# Patient Record
Sex: Female | Born: 2000 | Race: White | Hispanic: No | Marital: Single | Smoking: Current every day smoker
Health system: Southern US, Community
[De-identification: ages and names within clinical notes are randomized; demographics above are authoritative.]

## PROBLEM LIST (undated history)

## (undated) DIAGNOSIS — G43909 Migraine, unspecified, not intractable, without status migrainosus: Secondary | ICD-10-CM

## (undated) HISTORY — PX: DILATION AND CURETTAGE OF UTERUS: SHX78

## (undated) HISTORY — DX: Migraine, unspecified, not intractable, without status migrainosus: G43.909

---
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
## 2020-01-09 MED FILL — ELINEST 0.3-30 MG-MCG TABS: 0.3-30 | 56 days supply | Qty: 56 | Fill #0

## 2020-01-16 MED FILL — SERTRALINE HCL 50 MG TABS: 50 | 30 days supply | Qty: 30 | Fill #2

## 2020-02-19 MED FILL — SERTRALINE HCL 50 MG TABLET: 50 | 30 days supply | Qty: 30 | Fill #3

## 2020-03-03 ENCOUNTER — Telehealth: Payer: 59 | Admitting: Family

## 2020-03-03 DIAGNOSIS — F418 Other specified anxiety disorders: Secondary | ICD-10-CM

## 2020-03-03 NOTE — Progress Notes (Signed)
Attempted to reach the patient by text/ MyChart on 2 occasions; called and left VM to call back if she wanted the appointment;

## 2020-03-19 ENCOUNTER — Other Ambulatory Visit: Payer: Self-pay | Admitting: Family

## 2020-03-19 ENCOUNTER — Ambulatory Visit (INDEPENDENT_AMBULATORY_CARE_PROVIDER_SITE_OTHER): Payer: 59 | Admitting: Family

## 2020-03-19 ENCOUNTER — Other Ambulatory Visit: Payer: Self-pay

## 2020-03-19 VITALS — BP 112/74 | HR 74 | Temp 98.5°F | Ht 66.26 in | Wt 134.0 lb

## 2020-03-19 DIAGNOSIS — Z308 Encounter for other contraceptive management: Secondary | ICD-10-CM | POA: Diagnosis not present

## 2020-03-19 DIAGNOSIS — F418 Other specified anxiety disorders: Secondary | ICD-10-CM

## 2020-03-19 MED ORDER — SERTRALINE HCL 50 MG PO TABS: 50.0000 mg | ORAL_TABLET | Freq: Every day | ORAL | 1 refills | Status: DC

## 2020-03-19 MED ORDER — NORGESTREL-ETHINYL ESTRADIOL 0.3-30 MG-MCG PO TABS: 1.0000 | ORAL_TABLET | Freq: Every day | ORAL | 0 refills | Status: DC

## 2020-03-19 MED FILL — ELINEST 0.3-30 MG-MCG TABS: 0.3-30 | 84 days supply | Qty: 84 | Fill #0

## 2020-03-19 MED FILL — SERTRALINE HCL 50 MG TABS: 50 | 30 days supply | Qty: 30 | Fill #0
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
## 2020-04-02 ENCOUNTER — Other Ambulatory Visit (HOSPITAL_BASED_OUTPATIENT_CLINIC_OR_DEPARTMENT_OTHER): Payer: Self-pay

## 2020-05-29 ENCOUNTER — Other Ambulatory Visit (HOSPITAL_COMMUNITY): Payer: Self-pay

## 2020-05-29 MED FILL — Sertraline HCl Tab 50 MG: ORAL | 30 days supply | Qty: 30 | Fill #0 | Status: AC

## 2020-05-31 ENCOUNTER — Other Ambulatory Visit (HOSPITAL_COMMUNITY): Payer: Self-pay

## 2020-06-02 ENCOUNTER — Other Ambulatory Visit (HOSPITAL_COMMUNITY): Payer: Self-pay

## 2020-06-03 ENCOUNTER — Other Ambulatory Visit (HOSPITAL_COMMUNITY): Payer: Self-pay

## 2020-06-03 MED ORDER — NORGESTIM-ETH ESTRAD TRIPHASIC 0.18/0.215/0.25 MG-35 MCG PO TABS
ORAL_TABLET | ORAL | 12 refills | Status: DC
  Filled 2020-06-03: qty 28, 28d supply, fill #0

## 2020-07-31 ENCOUNTER — Other Ambulatory Visit (HOSPITAL_COMMUNITY): Payer: Self-pay

## 2020-07-31 MED FILL — Sertraline HCl Tab 50 MG: ORAL | 30 days supply | Qty: 30 | Fill #1 | Status: AC

## 2020-08-02 ENCOUNTER — Other Ambulatory Visit (HOSPITAL_COMMUNITY): Payer: Self-pay

## 2020-08-03 ENCOUNTER — Emergency Department (HOSPITAL_BASED_OUTPATIENT_CLINIC_OR_DEPARTMENT_OTHER): Payer: 59

## 2020-08-03 ENCOUNTER — Other Ambulatory Visit (HOSPITAL_COMMUNITY): Payer: Self-pay

## 2020-08-03 ENCOUNTER — Other Ambulatory Visit: Payer: Self-pay

## 2020-08-03 ENCOUNTER — Emergency Department (HOSPITAL_BASED_OUTPATIENT_CLINIC_OR_DEPARTMENT_OTHER)
Admission: EM | Admit: 2020-08-03 | Discharge: 2020-08-03 | Disposition: A | Discharge status: Home / Self Care | Payer: 59 | Attending: Emergency Medicine | Admitting: Emergency Medicine

## 2020-08-03 ENCOUNTER — Encounter (HOSPITAL_BASED_OUTPATIENT_CLINIC_OR_DEPARTMENT_OTHER): Payer: Self-pay

## 2020-08-03 DIAGNOSIS — N12 Tubulo-interstitial nephritis, not specified as acute or chronic: Secondary | ICD-10-CM | POA: Diagnosis not present

## 2020-08-03 DIAGNOSIS — D72829 Elevated white blood cell count, unspecified: Secondary | ICD-10-CM | POA: Diagnosis not present

## 2020-08-03 DIAGNOSIS — R109 Unspecified abdominal pain: Secondary | ICD-10-CM | POA: Diagnosis present

## 2020-08-03 LAB — URINALYSIS, ROUTINE W REFLEX MICROSCOPIC
Bilirubin Urine: NEGATIVE
Glucose, UA: NEGATIVE mg/dL
Ketones, ur: NEGATIVE mg/dL
Nitrite: NEGATIVE
Protein, ur: 30 mg/dL — AB
RBC / HPF: >50 RBC/hpf — ABNORMAL HIGH (ref 0–5)
Specific Gravity, Urine: 1.01 (ref 1.005–1.030)
WBC, UA: >50 WBC/hpf — ABNORMAL HIGH (ref 0–5)
pH: 6.5 (ref 5.0–8.0)

## 2020-08-03 LAB — CBC
HCT: 37.6 % (ref 36.0–46.0)
Hemoglobin: 12.1 g/dL (ref 12.0–15.0)
MCH: 27.7 pg (ref 26.0–34.0)
MCHC: 32.2 g/dL (ref 30.0–36.0)
MCV: 86 fL (ref 80.0–100.0)
Platelets: 308 10*3/uL (ref 150–400)
RBC: 4.37 MIL/uL (ref 3.87–5.11)
RDW: 13.3 % (ref 11.5–15.5)
WBC: 10.6 10*3/uL — ABNORMAL HIGH (ref 4.0–10.5)
nRBC: 0 % (ref 0.0–0.2)

## 2020-08-03 LAB — COMPREHENSIVE METABOLIC PANEL
ALT: 7 U/L (ref 0–44)
AST: 13 U/L — ABNORMAL LOW (ref 15–41)
Albumin: 4.7 g/dL (ref 3.5–5.0)
Alkaline Phosphatase: 60 U/L (ref 38–126)
Anion gap: 8 (ref 5–15)
BUN: 8 mg/dL (ref 6–20)
CO2: 25 mmol/L (ref 22–32)
Calcium: 9.4 mg/dL (ref 8.9–10.3)
Chloride: 105 mmol/L (ref 98–111)
Creatinine, Ser: 0.69 mg/dL (ref 0.44–1.00)
GFR, Estimated: >60 mL/min (ref >60)
Glucose, Bld: 85 mg/dL (ref 70–99)
Potassium: 3.6 mmol/L (ref 3.5–5.1)
Sodium: 138 mmol/L (ref 135–145)
Total Bilirubin: 0.3 mg/dL (ref 0.3–1.2)
Total Protein: 8.1 g/dL (ref 6.5–8.1)

## 2020-08-03 LAB — PREGNANCY, URINE: Preg Test, Ur: NEGATIVE

## 2020-08-03 MED ORDER — SODIUM CHLORIDE 0.9 % IV SOLN
1.0000 g | Freq: Once | INTRAVENOUS | Status: AC
  Administered 2020-08-03: 1 g via INTRAVENOUS
  Filled 2020-08-03: qty 10

## 2020-08-03 MED ORDER — ACETAMINOPHEN 500 MG PO TABS
1000.0000 mg | ORAL_TABLET | Freq: Once | ORAL | Status: AC
  Administered 2020-08-03: 1000 mg via ORAL
  Filled 2020-08-03: qty 2

## 2020-08-03 MED ORDER — CEFPODOXIME PROXETIL 200 MG PO TABS
200.0000 mg | ORAL_TABLET | Freq: Two times a day (BID) | ORAL | 0 refills | Status: AC
  Filled 2020-08-03 (×3): qty 14, 7d supply, fill #0

## 2020-08-03 NOTE — ED Provider Notes (Signed)
[facility] EMERGENCY DEPT Provider Note   CSN: [id number] Arrival date & time: 08/03/20  9678     History Chief Complaint  Patient presents with   Flank Pain    Anita Mcgee is a 20 y.o. female.  The history is provided by the patient.  Flank Pain This is a new problem. The current episode started 3 to 5 hours ago. The problem occurs constantly. The problem has not changed since onset.Pertinent negatives include no chest pain, no abdominal pain, no headaches and no shortness of breath. Nothing aggravates the symptoms. Nothing relieves the symptoms. She has tried acetaminophen for the symptoms. The treatment provided no relief.      Past Medical History:  Diagnosis Date   Irritable bowel 04/20/2015   Migraine    Vasovagal syncope 10/20/2019    Patient Active Problem List   Diagnosis Date Noted   Vasovagal syncope 10/20/2019   Poor posture 06/01/2016   Foot contusion 05/25/2016   Insect bite 05/05/2016   Rash 05/05/2016   Contraception management 04/20/2015   Irritable bowel 04/20/2015    History reviewed. No pertinent surgical history.   OB History   No obstetric history on file.     Family History  Problem Relation Age of Onset   Hypertension Father    Hypertension Maternal Grandmother    Diabetes Maternal Grandmother    Hypertension Paternal Grandfather    Heart disease Paternal Grandfather    Diabetes Paternal Grandfather    Stomach cancer Paternal Grandmother    Esophageal cancer Paternal Grandmother    Colon cancer Neg Hx    Pancreatic cancer Neg Hx    Liver disease Neg Hx     Social History   Tobacco Use   Smoking status: Never   Smokeless tobacco: Never  Vaping Use   Vaping Use: Every day   Start date: 01/09/2017   Substances: Nicotine  Substance Use Topics   Alcohol use: No   Drug use: No    Home Medications Prior to Admission medications   Medication Sig Start Date End Date Taking? Authorizing Provider  acetaminophen  (TYLENOL) 325 MG tablet Take 650 mg by mouth every 6 (six) hours as needed.   Yes [provider]  Bismuth Subsalicylate (PEPTO-BISMOL) 262 MG TABS Take 1 tablet by mouth as needed.   Yes [provider]  sertraline (ZOLOFT) 50 MG tablet TAKE 1 TABLET BY MOUTH ONCE A DAY 03/19/20 03/19/21 Yes [name], FNP  Hyoscyamine Sulfate SL 0.125 MG SUBL PLACE 1 TABLET UNDER THE TONGUE EVERY 4 (FOUR) HOURS AS NEEDED (ABDOMINAL PAIN). 10/20/19 10/19/20  Iva Boop, MD  Norgestimate-Ethinyl Estradiol Triphasic 0.18/0.215/0.25 MG-35 MCG tablet Take 1 tablet by mouth daily 06/03/20     norgestrel-ethinyl estradiol (LO/OVRAL) 0.3-30 MG-MCG tablet TAKE 1 TABLET BY MOUTH ONCE A DAY 03/19/20 03/19/21  [name], FNP    Allergies    Ibuprofen  Review of Systems   Review of Systems  Constitutional:  Negative for chills and fever.  HENT:  Negative for ear pain and sore throat.   Eyes:  Negative for pain and visual disturbance.  Respiratory:  Negative for cough and shortness of breath.   Cardiovascular:  Negative for chest pain and palpitations.  Gastrointestinal:  Negative for abdominal pain, constipation, diarrhea, nausea and vomiting.  Genitourinary:  Positive for flank pain and frequency. Negative for dysuria and hematuria.  Musculoskeletal:  Negative for arthralgias and back pain.  Skin:  Negative for color change and rash.  Neurological:  Negative for seizures, syncope and headaches.  All other systems reviewed and are negative.  Physical Exam Updated Vital Signs BP 120/78 (BP Location: Right Arm)   Pulse 81   Temp 98.5 F (36.9 C) (Oral)   Resp 16   Ht 5\' 5"  (1.651 m)   Wt 56.7 kg   LMP 08/03/2020 (Exact Date)   SpO2 100%   BMI 20.80 kg/m   Physical Exam Vitals and nursing note reviewed.  HENT:     Head: Normocephalic and atraumatic.  Eyes:     General: No scleral icterus. Pulmonary:     Effort: Pulmonary effort is normal. No respiratory  distress.  Abdominal:     Tenderness: There is no abdominal tenderness. There is left CVA tenderness.  Musculoskeletal:     Cervical back: Normal range of motion.  Skin:    General: Skin is warm and dry.  Neurological:     General: No focal deficit present.     Mental Status: She is alert and oriented to person, place, and time.  Psychiatric:        Mood and Affect: Mood normal.    ED Results / Procedures / Treatments   Labs (all labs ordered are listed, but only abnormal results are displayed) Labs Reviewed  CBC - Abnormal; Notable for the following components:      Result Value   WBC 10.6 (*)    All other components within normal limits  COMPREHENSIVE METABOLIC PANEL - Abnormal; Notable for the following components:   AST 13 (*)    All other components within normal limits  URINALYSIS, ROUTINE W REFLEX MICROSCOPIC - Abnormal; Notable for the following components:   APPearance HAZY (*)    Hgb urine dipstick LARGE (*)    Protein, ur 30 (*)    Leukocytes,Ua LARGE (*)    RBC / HPF >50 (*)    WBC, UA >50 (*)    Bacteria, UA MANY (*)    All other components within normal limits  PREGNANCY, URINE    EKG None  Radiology 08/05/2020 Renal  Result Date: 08/03/2020 CLINICAL DATA:  20 year old female with acute left flank pain since 0400 hours. EXAM: RENAL / URINARY TRACT ULTRASOUND COMPLETE COMPARISON:  None. FINDINGS: Right Kidney: Renal measurements: 10.5 x 4.0 x 5.3 cm = volume: 118 mL. Echogenicity within normal limits. No mass or hydronephrosis visualized. Left Kidney: Renal measurements: 10.4 x 6.2 x 5.1 cm = volume: 171 mL. Echogenicity within normal limits. No mass or hydronephrosis visualized. Bladder: Appears normal for degree of bladder distention. Other: None. IMPRESSION: Normal ultrasound appearance of both kidneys and the urinary bladder. Electronically Signed   By: 26 M.D.   On: 08/03/2020 08:17    Procedures Procedures   Medications Ordered in ED Medications   cefTRIAXone (ROCEPHIN) 1 g in sodium chloride 0.9 % 100 mL IVPB (has no administration in time range)  acetaminophen (TYLENOL) tablet 1,000 mg (1,000 mg Oral Given 08/03/20 08/05/20)    ED Course  I have reviewed the triage vital signs and the nursing notes.  Pertinent labs & imaging results that were available during my care of the patient were reviewed by me and considered in my medical decision making (see chart for details).    MDM Rules/Calculators/A&P                           [facility] with left flank pain and several days of urinary [place].  She is well-appearing and afebrile.  ED work-up reveals a slightly elevated white count, normal renal function, and a normal renal ultrasound.  Large, obstructing renal stone much less likely than alternative etiologies.  Urinalysis supports likely urinary tract infection, and I am suspicious for mild or early pyelonephritis given her flank pain.  She will be given Rocephin here and treated with cefpodoxime.  Careful return precautions were given. Final Clinical Impression(s) / ED Diagnoses Final diagnoses:  Pyelonephritis    Rx / DC Orders ED Discharge Orders     None        Koleen Distance, MD 08/03/20 [phone]

## 2020-08-03 NOTE — ED Notes (Signed)
ED Provider at bedside. 

## 2020-08-03 NOTE — ED Provider Notes (Signed)
MSE was initiated and I personally evaluated the patient and placed orders (if any) at  6:52 AM on August 03, 2020.  The patient appears stable so that the remainder of the MSE may be completed by another provider.   Sabas Sous, MD 08/03/20 [phone]

## 2020-08-03 NOTE — ED Triage Notes (Signed)
Pt states that flank pain on left side started at 4 am 08/03/20. Patient rates pain at 9/10 scale. Patient states pain is squeezing.

## 2020-09-16 ENCOUNTER — Other Ambulatory Visit (HOSPITAL_COMMUNITY): Payer: Self-pay

## 2020-09-16 MED FILL — Sertraline HCl Tab 50 MG: ORAL | 30 days supply | Qty: 30 | Fill #2 | Status: AC

## 2020-10-19 ENCOUNTER — Other Ambulatory Visit (HOSPITAL_COMMUNITY): Payer: Self-pay

## 2020-10-19 MED FILL — Sertraline HCl Tab 50 MG: ORAL | 30 days supply | Qty: 30 | Fill #3 | Status: AC

## 2020-11-24 ENCOUNTER — Other Ambulatory Visit (HOSPITAL_COMMUNITY): Payer: Self-pay

## 2020-11-24 MED ORDER — SULFAMETHOXAZOLE-TRIMETHOPRIM 800-160 MG PO TABS
ORAL_TABLET | ORAL | 0 refills | Status: DC
  Filled 2020-11-24: qty 6, 3d supply, fill #0

## 2020-12-01 ENCOUNTER — Other Ambulatory Visit (HOSPITAL_COMMUNITY): Payer: Self-pay

## 2020-12-01 MED FILL — Sertraline HCl Tab 50 MG: ORAL | 30 days supply | Qty: 30 | Fill #4 | Status: AC

## 2020-12-07 ENCOUNTER — Other Ambulatory Visit (HOSPITAL_COMMUNITY)
Admission: RE | Admit: 2020-12-07 | Discharge: 2020-12-07 | Disposition: A | Discharge status: Home / Self Care | Payer: 59 | Source: Ambulatory Visit | Attending: Family | Admitting: Family

## 2020-12-07 ENCOUNTER — Encounter: Payer: Self-pay | Admitting: Family

## 2020-12-07 ENCOUNTER — Other Ambulatory Visit: Payer: Self-pay

## 2020-12-07 ENCOUNTER — Ambulatory Visit: Payer: 59 | Admitting: Family

## 2020-12-07 ENCOUNTER — Other Ambulatory Visit (HOSPITAL_BASED_OUTPATIENT_CLINIC_OR_DEPARTMENT_OTHER): Payer: Self-pay

## 2020-12-07 VITALS — BP 100/60 | HR 86 | Temp 97.8°F | Ht 66.0 in | Wt 117.0 lb

## 2020-12-07 DIAGNOSIS — R300 Dysuria: Secondary | ICD-10-CM | POA: Diagnosis not present

## 2020-12-07 DIAGNOSIS — N760 Acute vaginitis: Secondary | ICD-10-CM | POA: Insufficient documentation

## 2020-12-07 MED ORDER — METRONIDAZOLE 500 MG PO TABS
500.0000 mg | ORAL_TABLET | Freq: Two times a day (BID) | ORAL | 0 refills | Status: DC
  Filled 2020-12-07: qty 14, 7d supply, fill #0

## 2020-12-07 NOTE — Addendum Note (Signed)
Addended by: Mervin Kung A on: 12/07/2020 01:46 PM   Modules accepted: Orders

## 2020-12-07 NOTE — Patient Instructions (Signed)

## 2020-12-07 NOTE — Progress Notes (Signed)
Anita Mcgee is a 20 y.o. female with the following history as recorded in EpicCare:  Patient Active Problem List   Diagnosis Date Noted   Vasovagal syncope 10/20/2019   Poor posture 06/01/2016   Foot contusion 05/25/2016   Insect bite 05/05/2016   Rash 05/05/2016   Contraception management 04/20/2015   Irritable bowel 04/20/2015    Current Outpatient Medications  Medication Sig Dispense Refill   acetaminophen (TYLENOL) 325 MG tablet Take 650 mg by mouth every 6 (six) hours as needed.     Bismuth Subsalicylate (PEPTO-BISMOL) 262 MG TABS Take 1 tablet by mouth as needed.     metroNIDAZOLE (FLAGYL) 500 MG tablet Take 1 tablet (500 mg total) by mouth 2 (two) times daily for 7 days. 14 tablet 0   sertraline (ZOLOFT) 50 MG tablet TAKE 1 TABLET BY MOUTH ONCE A DAY 90 tablet 1   Hyoscyamine Sulfate SL 0.125 MG SUBL PLACE 1 TABLET UNDER THE TONGUE EVERY 4 (FOUR) HOURS AS NEEDED (ABDOMINAL PAIN). 60 tablet 0   Norgestimate-Ethinyl Estradiol Triphasic 0.18/0.215/0.25 MG-35 MCG tablet Take 1 tablet by mouth daily (Patient not taking: Reported on 12/07/2020) 28 tablet 12   norgestrel-ethinyl estradiol (LO/OVRAL) 0.3-30 MG-MCG tablet TAKE 1 TABLET BY MOUTH ONCE A DAY (Patient not taking: Reported on 12/07/2020) 84 tablet 0   No current facility-administered medications for this visit.    Allergies: Ibuprofen  Past Medical History:  Diagnosis Date   Irritable bowel 04/20/2015   Migraine    Vasovagal syncope 10/20/2019    No past surgical history on file.  Family History  Problem Relation Age of Onset   Hypertension Father    Hypertension Maternal Grandmother    Diabetes Maternal Grandmother    Hypertension Paternal Grandfather    Heart disease Paternal Grandfather    Diabetes Paternal Grandfather    Stomach cancer Paternal Grandmother    Esophageal cancer Paternal Grandmother    Colon cancer Neg Hx    Pancreatic cancer Neg Hx    Liver disease Neg Hx     Social History   Tobacco Use    Smoking status: Never   Smokeless tobacco: Never  Substance Use Topics   Alcohol use: No    Subjective:  Accompanied by boyfriend today; Concerned about 3 week history of urinary and vaginal discomfort; GYN treated for UTI but culture was clear;  LMP 11/19     Objective:  Vitals:   12/07/20 1134  BP: 100/60  Pulse: 86  Temp: 97.8 F (36.6 C)  TempSrc: Oral  SpO2: 98%  Weight: 117 lb (53.1 kg)  Height: 5\' 6"  (1.676 m)    General: Well developed, well nourished, in no acute distress  Skin : Warm and dry.  Head: Normocephalic and atraumatic  Lungs: Respirations unlabored;  Neurologic: Alert and oriented; speech intact; face symmetrical; moves all extremities well; CNII-XII intact without focal deficit  Vaginal exam- whitish discharge noted;  Assessment:  1. Acute vaginitis     Plan:  Check urine culture and vaginal swab; suspect bacterial vaginosis;  Rx for Flagyl 500 mg bid x 7 days- no alcohol while on this medication;  Follow up to be determined;  This visit occurred during the SARS-CoV-2 public health emergency.  Safety protocols were in place, including screening questions prior to the visit, additional usage of staff PPE, and extensive cleaning of exam room while observing appropriate contact time as indicated for disinfecting solutions.    No follow-ups on file.  No orders of the defined types  were placed in this encounter.   Requested Prescriptions   Signed Prescriptions Disp Refills   metroNIDAZOLE (FLAGYL) 500 MG tablet 14 tablet 0    Sig: Take 1 tablet (500 mg total) by mouth 2 (two) times daily for 7 days.

## 2020-12-08 LAB — CERVICOVAGINAL ANCILLARY ONLY
Bacterial Vaginitis (gardnerella): POSITIVE — AB
Candida Glabrata: NEGATIVE
Candida Vaginitis: NEGATIVE
Chlamydia: NEGATIVE
Comment: NEGATIVE
Comment: NEGATIVE
Comment: NEGATIVE
Comment: NEGATIVE
Comment: NEGATIVE
Comment: NORMAL
Neisseria Gonorrhea: NEGATIVE
Trichomonas: NEGATIVE

## 2020-12-08 LAB — URINE CULTURE
MICRO NUMBER:: 12689738
Result:: NO GROWTH
SPECIMEN QUALITY:: ADEQUATE

## 2020-12-12 ENCOUNTER — Encounter: Payer: Self-pay | Admitting: Family

## 2020-12-14 ENCOUNTER — Other Ambulatory Visit (HOSPITAL_COMMUNITY)
Admission: RE | Admit: 2020-12-14 | Discharge: 2020-12-14 | Disposition: A | Discharge status: Home / Self Care | Payer: 59 | Source: Ambulatory Visit | Attending: Family | Admitting: Family

## 2020-12-14 ENCOUNTER — Encounter: Payer: Self-pay | Admitting: Family

## 2020-12-14 ENCOUNTER — Other Ambulatory Visit (HOSPITAL_BASED_OUTPATIENT_CLINIC_OR_DEPARTMENT_OTHER): Payer: Self-pay

## 2020-12-14 ENCOUNTER — Ambulatory Visit: Payer: 59 | Admitting: Family

## 2020-12-14 VITALS — BP 102/60 | HR 73 | Temp 97.9°F | Ht 65.0 in | Wt 117.8 lb

## 2020-12-14 DIAGNOSIS — R102 Pelvic and perineal pain: Secondary | ICD-10-CM | POA: Insufficient documentation

## 2020-12-14 DIAGNOSIS — N898 Other specified noninflammatory disorders of vagina: Secondary | ICD-10-CM | POA: Diagnosis present

## 2020-12-14 MED ORDER — FLUCONAZOLE 150 MG PO TABS
ORAL_TABLET | ORAL | 0 refills | Status: DC
  Filled 2020-12-14: qty 2, 3d supply, fill #0

## 2020-12-14 NOTE — Progress Notes (Signed)
Anita Mcgee is a 20 y.o. female with the following history as recorded in EpicCare:  Patient Active Problem List   Diagnosis Date Noted   Vasovagal syncope 10/20/2019   Poor posture 06/01/2016   Foot contusion 05/25/2016   Insect bite 05/05/2016   Rash 05/05/2016   Contraception management 04/20/2015   Irritable bowel 04/20/2015    Current Outpatient Medications  Medication Sig Dispense Refill   acetaminophen (TYLENOL) 325 MG tablet Take 650 mg by mouth every 6 (six) hours as needed.     Bismuth Subsalicylate (PEPTO-BISMOL) 262 MG TABS Take 1 tablet by mouth as needed.     fluconazole (DIFLUCAN) 150 MG tablet Take 1 tablet today; repeat after 72 hours 2 tablet 0   Norgestimate-Ethinyl Estradiol Triphasic 0.18/0.215/0.25 MG-35 MCG tablet Take 1 tablet by mouth daily 28 tablet 12   norgestrel-ethinyl estradiol (LO/OVRAL) 0.3-30 MG-MCG tablet TAKE 1 TABLET BY MOUTH ONCE A DAY 84 tablet 0   sertraline (ZOLOFT) 50 MG tablet TAKE 1 TABLET BY MOUTH ONCE A DAY 90 tablet 1   Hyoscyamine Sulfate SL 0.125 MG SUBL PLACE 1 TABLET UNDER THE TONGUE EVERY 4 (FOUR) HOURS AS NEEDED (ABDOMINAL PAIN). 60 tablet 0   No current facility-administered medications for this visit.    Allergies: Ibuprofen  Past Medical History:  Diagnosis Date   Irritable bowel 04/20/2015   Migraine    Vasovagal syncope 10/20/2019    No past surgical history on file.  Family History  Problem Relation Age of Onset   Hypertension Father    Hypertension Maternal Grandmother    Diabetes Maternal Grandmother    Hypertension Paternal Grandfather    Heart disease Paternal Grandfather    Diabetes Paternal Grandfather    Stomach cancer Paternal Grandmother    Esophageal cancer Paternal Grandmother    Colon cancer Neg Hx    Pancreatic cancer Neg Hx    Liver disease Neg Hx     Social History   Tobacco Use   Smoking status: Every Day    Types: E-cigarettes    Start date: 2020   Smokeless tobacco: Never  Substance Use  Topics   Alcohol use: No    Subjective:   Was seen last week with vaginitis- diagnosed with BV and has been on oral Flagyl since that time; feels that she has been having cramping since being on the medication; was recommended to hold medication and see how she responded; wanted to get repeat vaginal exam for further evaluation;  Notes that pain is localized on her right side; does mention that she has been told she has possible endometriosis per her GYN; no personal history of ovarian cyst or fibroids;   LMP 11/19     Objective:  Vitals:   12/14/20 0940  BP: 102/60  Pulse: 73  Temp: 97.9 F (36.6 C)  TempSrc: Oral  SpO2: 99%  Weight: 117 lb 12.8 oz (53.4 kg)  Height: 5\' 5"  (1.651 m)    General: Well developed, well nourished, in no acute distress  Skin : Warm and dry.  Head: Normocephalic and atraumatic  Lungs: Respirations unlabored;  Neurologic: Alert and oriented; speech intact; face symmetrical; moves all extremities well; CNII-XII intact without focal deficit  Pelvic exam- thick vaginal discharge noted; suspicious for right ovarian cyst on bimanual exam;   Assessment:  1. Vaginal discharge   2. Pelvic pain     Plan:  Repeat cervicovaginal swab; will go ahead and treat for yeast infection secondary to BV; Update pelvic ultrasound today-  will most likely need to follow up with her GYN;  This visit occurred during the SARS-CoV-2 public health emergency.  Safety protocols were in place, including screening questions prior to the visit, additional usage of staff PPE, and extensive cleaning of exam room while observing appropriate contact time as indicated for disinfecting solutions.    No follow-ups on file.  Orders Placed This Encounter  Procedures   US Pelvic Complete With Transvaginal    Standing Status:   Future    Standing Expiration Date:   12/14/2021    Order Specific Question:   Reason for Exam (SYMPTOM  OR DIAGNOSIS REQUIRED)    Answer:   pelvic pain     Order Specific Question:   Preferred imaging location?    Answer:   [facility]    Requested Prescriptions   Signed Prescriptions Disp Refills   fluconazole (DIFLUCAN) 150 MG tablet 2 tablet 0    Sig: Take 1 tablet today; repeat after 72 hours

## 2020-12-15 ENCOUNTER — Other Ambulatory Visit: Payer: Self-pay

## 2020-12-15 ENCOUNTER — Ambulatory Visit (HOSPITAL_BASED_OUTPATIENT_CLINIC_OR_DEPARTMENT_OTHER)
Admission: RE | Admit: 2020-12-15 | Discharge: 2020-12-15 | Disposition: A | Discharge status: Home / Self Care | Payer: 59 | Source: Ambulatory Visit | Attending: Family | Admitting: Family

## 2020-12-15 ENCOUNTER — Telehealth: Payer: Self-pay | Admitting: Family

## 2020-12-15 DIAGNOSIS — R102 Pelvic and perineal pain: Secondary | ICD-10-CM | POA: Insufficient documentation

## 2020-12-15 LAB — CERVICOVAGINAL ANCILLARY ONLY
Bacterial Vaginitis (gardnerella): NEGATIVE
Candida Glabrata: NEGATIVE
Candida Vaginitis: NEGATIVE
Chlamydia: NEGATIVE
Comment: NEGATIVE
Comment: NEGATIVE
Comment: NEGATIVE
Comment: NEGATIVE
Comment: NEGATIVE
Comment: NORMAL
Neisseria Gonorrhea: NEGATIVE
Trichomonas: NEGATIVE

## 2020-12-15 NOTE — Telephone Encounter (Signed)
Fax number: [phone]

## 2020-12-15 NOTE — Telephone Encounter (Signed)
Patient states she needs her ultrasound results faxed over to her OBGYN at [facility] in [place]. She will call back with their fax number.

## 2020-12-15 NOTE — Progress Notes (Signed)
[place] results were sent to the fax number.   Pt was notified.

## 2020-12-17 NOTE — Telephone Encounter (Signed)
Forms have been faxed to [facility]

## 2021-01-17 ENCOUNTER — Other Ambulatory Visit (HOSPITAL_COMMUNITY): Payer: Self-pay

## 2021-01-17 ENCOUNTER — Other Ambulatory Visit: Payer: Self-pay | Admitting: Family

## 2021-01-17 MED ORDER — SERTRALINE HCL 50 MG PO TABS
ORAL_TABLET | Freq: Every day | ORAL | 1 refills | Status: DC
  Filled 2021-01-17: qty 90, 90d supply, fill #0
  Filled 2021-04-18: qty 90, 90d supply, fill #1

## 2021-01-19 ENCOUNTER — Other Ambulatory Visit (HOSPITAL_COMMUNITY): Payer: Self-pay

## 2021-04-18 ENCOUNTER — Other Ambulatory Visit (HOSPITAL_COMMUNITY): Payer: Self-pay

## 2021-04-28 DIAGNOSIS — Z03818 Encounter for observation for suspected exposure to other biological agents ruled out: Secondary | ICD-10-CM | POA: Diagnosis not present

## 2021-04-28 DIAGNOSIS — J029 Acute pharyngitis, unspecified: Secondary | ICD-10-CM | POA: Diagnosis not present

## 2021-04-28 DIAGNOSIS — Z20822 Contact with and (suspected) exposure to covid-19: Secondary | ICD-10-CM | POA: Diagnosis not present

## 2021-08-13 ENCOUNTER — Other Ambulatory Visit: Payer: Self-pay | Admitting: Family

## 2021-08-13 ENCOUNTER — Other Ambulatory Visit (HOSPITAL_COMMUNITY): Payer: Self-pay

## 2021-08-15 ENCOUNTER — Other Ambulatory Visit (HOSPITAL_COMMUNITY): Payer: Self-pay

## 2021-08-15 MED ORDER — SERTRALINE HCL 50 MG PO TABS
ORAL_TABLET | Freq: Every day | ORAL | 1 refills | Status: DC
  Filled 2021-08-15: qty 90, 90d supply, fill #0

## 2021-09-06 ENCOUNTER — Ambulatory Visit: Payer: BC Managed Care – PPO | Admitting: Family

## 2021-09-06 ENCOUNTER — Other Ambulatory Visit (HOSPITAL_COMMUNITY): Payer: Self-pay

## 2021-09-06 ENCOUNTER — Encounter: Payer: Self-pay | Admitting: Family

## 2021-09-06 VITALS — BP 100/58 | HR 60 | Temp 98.5°F | Ht 65.0 in | Wt 115.0 lb

## 2021-09-06 DIAGNOSIS — F418 Other specified anxiety disorders: Secondary | ICD-10-CM

## 2021-09-06 MED ORDER — SERTRALINE HCL 50 MG PO TABS
50.0000 mg | ORAL_TABLET | Freq: Every day | ORAL | 3 refills | Status: DC
  Filled 2021-09-06 – 2022-02-08 (×2): qty 90, 90d supply, fill #0

## 2021-09-06 NOTE — Progress Notes (Signed)
  Anita Mcgee is a 21 y.o. female with the following history as recorded in EpicCare:  Patient Active Problem List   Diagnosis Date Noted   Vasovagal syncope 10/20/2019   Poor posture 06/01/2016   Foot contusion 05/25/2016   Insect bite 05/05/2016   Rash 05/05/2016   Contraception management 04/20/2015   Irritable bowel 04/20/2015    Current Outpatient Medications  Medication Sig Dispense Refill   acetaminophen (TYLENOL) 325 MG tablet Take 650 mg by mouth every 6 (six) hours as needed.     Bismuth Subsalicylate (PEPTO-BISMOL) 262 MG TABS Take 1 tablet by mouth as needed.     sertraline (ZOLOFT) 50 MG tablet TAKE 1 TABLET BY MOUTH ONCE A DAY 90 tablet 3   No current facility-administered medications for this visit.    Allergies: Ibuprofen  Past Medical History:  Diagnosis Date   Irritable bowel 04/20/2015   Migraine    Vasovagal syncope 10/20/2019    No past surgical history on file.  Family History  Problem Relation Age of Onset   Hypertension Father    Hypertension Maternal Grandmother    Diabetes Maternal Grandmother    Hypertension Paternal Grandfather    Heart disease Paternal Grandfather    Diabetes Paternal Grandfather    Stomach cancer Paternal Grandmother    Esophageal cancer Paternal Grandmother    Colon cancer Neg Hx    Pancreatic cancer Neg Hx    Liver disease Neg Hx     Social History   Tobacco Use   Smoking status: Every Day    Types: E-cigarettes    Start date: 2020   Smokeless tobacco: Never  Substance Use Topics   Alcohol use: No    Subjective:  Patient presents for follow up on Zoloft; doing well; requesting refills;  LMP- August 8/ okay to be off OCPs  Planning to see her GYN in September for yearly CPE; had normal labs in 07/2020;      Objective:  Vitals:   09/06/21 1323  BP: (!) 100/58  Pulse: 60  Temp: 98.5 F (36.9 C)  TempSrc: Oral  SpO2: 96%  Weight: 115 lb (52.2 kg)  Height: 5\' 5"  (1.651 m)    General: Well developed,  well nourished, in no acute distress  Skin : Warm and dry.  Head: Normocephalic and atraumatic  Lungs: Respirations unlabored; clear to auscultation bilaterally without wheeze, rales, rhonchi  CVS exam: normal rate and regular rhythm.  Neurologic: Alert and oriented; speech intact; face symmetrical; moves all extremities well; CNII-XII intact without focal deficit   Assessment:  1. Situational anxiety     Plan:  Stable; refills updated; She is planning for yearly CPE with her GYN next month;  Follow up in 1 year, sooner prn   No follow-ups on file.  No orders of the defined types were placed in this encounter.   Requested Prescriptions   Signed Prescriptions Disp Refills   sertraline (ZOLOFT) 50 MG tablet 90 tablet 3    Sig: TAKE 1 TABLET BY MOUTH ONCE A DAY

## 2021-09-16 ENCOUNTER — Encounter (HOSPITAL_BASED_OUTPATIENT_CLINIC_OR_DEPARTMENT_OTHER): Payer: Self-pay

## 2021-09-16 ENCOUNTER — Other Ambulatory Visit: Payer: Self-pay

## 2021-09-16 ENCOUNTER — Emergency Department (HOSPITAL_BASED_OUTPATIENT_CLINIC_OR_DEPARTMENT_OTHER)
Admission: EM | Admit: 2021-09-16 | Discharge: 2021-09-16 | Disposition: A | Discharge status: Home / Self Care | Payer: BC Managed Care – PPO | Attending: Emergency Medicine | Admitting: Emergency Medicine

## 2021-09-16 ENCOUNTER — Other Ambulatory Visit (HOSPITAL_BASED_OUTPATIENT_CLINIC_OR_DEPARTMENT_OTHER): Payer: Self-pay

## 2021-09-16 DIAGNOSIS — R079 Chest pain, unspecified: Secondary | ICD-10-CM

## 2021-09-16 DIAGNOSIS — R0789 Other chest pain: Secondary | ICD-10-CM | POA: Diagnosis not present

## 2021-09-16 DIAGNOSIS — K219 Gastro-esophageal reflux disease without esophagitis: Secondary | ICD-10-CM | POA: Diagnosis not present

## 2021-09-16 MED ORDER — ALUM & MAG HYDROXIDE-SIMETH 200-200-20 MG/5ML PO SUSP
30.0000 mL | Freq: Once | ORAL | Status: AC
  Administered 2021-09-16: 30 mL via ORAL
  Filled 2021-09-16: qty 30

## 2021-09-16 MED ORDER — FAMOTIDINE 20 MG PO TABS
40.0000 mg | ORAL_TABLET | Freq: Once | ORAL | Status: AC
  Administered 2021-09-16: 40 mg via ORAL
  Filled 2021-09-16: qty 2

## 2021-09-16 MED ORDER — FAMOTIDINE 20 MG PO TABS
20.0000 mg | ORAL_TABLET | Freq: Two times a day (BID) | ORAL | 0 refills | Status: DC
  Filled 2021-09-16: qty 60, 30d supply, fill #0

## 2021-09-16 MED ORDER — LIDOCAINE VISCOUS HCL 2 % MT SOLN
15.0000 mL | Freq: Once | OROMUCOSAL | Status: AC
  Administered 2021-09-16: 15 mL via ORAL
  Filled 2021-09-16: qty 15

## 2021-09-16 NOTE — ED Triage Notes (Signed)
Patient c/o central chest squeezing pain x 2 days - states it was worse last night. Patient states pain is worse with eating/drinking.

## 2021-09-16 NOTE — ED Provider Notes (Addendum)
[facility] EMERGENCY DEPARTMENT Provider Note   CSN: [id number] Arrival date & time: 09/16/21  0915     History  Chief Complaint  Patient presents with   Chest Pain    Anita Mcgee is a 21 y.o. female presented to ED with chest discomfort.  She reports she has had a squeezing pain in the middle of her chest for the past 2 nights, worse overnight while laying in bed.  She also feels the pain is worse anytime she tries to eat or swallow or drink.  She has not had this feeling before.  She denies nausea, vomiting, diarrhea or constipation.  She denies any known history of heartburn.  She reports she has been eating [employer], cheeseburger and [place] fries in the past 24 hours.  She did not try any medications at home prior to coming in.  She contacted her PCPs office was told to come to the ED for chest pain evaluation.  She does not have any other medical problems aside from taking sertraline for anxiety.  She has no significant coronary or cardiac history.  HPI     Home Medications Prior to Admission medications   Medication Sig Start Date End Date Taking? Authorizing Provider  famotidine (PEPCID) 20 MG tablet Take 1 tablet (20 mg total) by mouth 2 (two) times daily. 09/16/21 10/16/21 Yes Jennine Peddy, Kermit Balo, MD  acetaminophen (TYLENOL) 325 MG tablet Take 650 mg by mouth every 6 (six) hours as needed.    [provider]  Bismuth Subsalicylate (PEPTO-BISMOL) 262 MG TABS Take 1 tablet by mouth as needed.    [provider]  sertraline (ZOLOFT) 50 MG tablet TAKE 1 TABLET BY MOUTH ONCE A DAY 09/06/21 09/06/22  [name], FNP      Allergies    Ibuprofen    Review of Systems   Review of Systems  Physical Exam Updated Vital Signs BP 108/73   Pulse 64   Temp 97.8 F (36.6 C)   Resp 17   Ht 5\' 5"  (1.651 m)   Wt 52.2 kg   LMP 09/11/2021 (Exact Date)   SpO2 100%   BMI 19.14 kg/m  Physical Exam Constitutional:      General: She is not in  acute distress. HENT:     Head: Normocephalic and atraumatic.  Eyes:     Conjunctiva/sclera: Conjunctivae normal.     Pupils: Pupils are equal, round, and reactive to light.  Cardiovascular:     Rate and Rhythm: Normal rate and regular rhythm.  Pulmonary:     Effort: Pulmonary effort is normal. No respiratory distress.  Abdominal:     General: There is no distension.     Palpations: There is no mass.     Tenderness: There is no abdominal tenderness. There is no guarding or rebound.  Skin:    General: Skin is warm and dry.  Neurological:     General: No focal deficit present.     Mental Status: She is alert. Mental status is at baseline.  Psychiatric:        Mood and Affect: Mood normal.        Behavior: Behavior normal.     ED Results / Procedures / Treatments   Labs (all labs ordered are listed, but only abnormal results are displayed) Labs Reviewed - No data to display  EKG None  Radiology No results found.  Procedures Procedures    Medications Ordered in ED Medications  alum & mag hydroxide-simeth (MAALOX/MYLANTA)  200-200-20 MG/5ML suspension 30 mL (30 mLs Oral Given 09/16/21 0955)    And  lidocaine (XYLOCAINE) 2 % viscous mouth solution 15 mL (15 mLs Oral Given 09/16/21 0955)  famotidine (PEPCID) tablet 40 mg (40 mg Oral Given 09/16/21 0954)    ED Course/ Medical Decision Making/ A&P Clinical Course as of 09/16/21 1105  Fri Sep 16, 2021  1057 Chest discomfort is improved after GI cocktail, still present.  I suspect this is reflux gastritis she will follow-up with her PCP for this.  I will start her on Pepcid [MT]  1104 EKG did not crossover preprinted EKG personally reviewed and interpreted.  Image added to media tab [MT]    Clinical Course User Index [MT] Terald Sleeper, MD                           Medical Decision Making Risk OTC drugs. Prescription drug management.   This patient presents to the Emergency Department with complaint of chest pain.  I  strongly suspect this is related with reflux gastritis, considering her symptoms are worse at night and worse with eating, and also given her diet for the past 24 hours.  We will try GI cocktail and Pepcid.  Her EKG was personally ordered and interpreted and reviewed by myself, showing normal sinus rhythm no acute ischemic findings or evidence of significant arrhythmia  I felt PE was less likely given that PERC negative  I have a low suspicion for pneumonia or pneumothorax do not feel we need emergent imaging of the chest at this time.  She has no abdominal pain or tenderness to suggest this related to pancreatitis or acute cholecystitis.  I doubt acute biliary disease.  After the interventions stated above, I reevaluated the patient and found that they were improved  Based on the patient's clinical exam, vital signs, risk factors, and ED testing, I felt that the patient's overall risk of life-threatening emergency such as ACS, PE, sepsis, or infection was low.  At this time, I felt the patient's presentation was most clinically consistent with reflux gastritis, but explained to the patient that this evaluation was not a definitive diagnostic workup.  I discussed outpatient follow up with primary care provider, and provided specialist office number on the patient's discharge paper if a referral was deemed necessary.  Return precautions were discussed with the patient.  I felt the patient was clinically stable for discharge.         Final Clinical Impression(s) / ED Diagnoses Final diagnoses:  Chest pain, unspecified type  Gastroesophageal reflux disease, unspecified whether esophagitis present    Rx / DC Orders ED Discharge Orders          Ordered    famotidine (PEPCID) 20 MG tablet  2 times daily        09/16/21 1059              Terald Sleeper, MD 09/16/21 1100    Terald Sleeper, MD 09/16/21 1105

## 2021-09-20 ENCOUNTER — Ambulatory Visit: Payer: BC Managed Care – PPO | Admitting: Family

## 2021-09-20 ENCOUNTER — Other Ambulatory Visit: Payer: Self-pay | Admitting: Family

## 2021-09-20 ENCOUNTER — Encounter: Payer: Self-pay | Admitting: Family

## 2021-09-20 VITALS — BP 90/60 | HR 74 | Temp 98.4°F | Ht 65.0 in | Wt 114.2 lb

## 2021-09-20 DIAGNOSIS — K2900 Acute gastritis without bleeding: Secondary | ICD-10-CM

## 2021-09-20 NOTE — Patient Instructions (Addendum)
Take the Pepcid twice a day for next 2 weeks as discussed;  Gastritis, Adult Gastritis is irritation and swelling (inflammation) of the stomach. There are two kinds of gastritis: Acute gastritis. This kind develops quickly. Chronic gastritis. This kind is much more common. It develops slowly and lasts for a long time. It is important to get help for this condition. If you do not get help, your stomach can bleed, and you can get sores (ulcers) in your stomach. What are the causes? This condition may be caused by: Germs that get to your stomach and cause an infection. Drinking too much alcohol. Medicines you are taking. Having too much acid in the stomach. Having a disease of the stomach. Other causes may include: An allergic reaction. Some cancer treatments (radiation). Smoking cigarettes or using products that contain nicotine or tobacco. In some cases, the cause of this condition is not known. What increases the risk? Having a disease of the intestines. Having Crohn's disease. Using aspirin or ibuprofen and other NSAIDs to treat other conditions. Stress. What are the signs or symptoms? Pain in your stomach. A burning feeling in your stomach. Feeling like you may vomit (nauseous). Vomiting or vomiting blood. Feeling too full after you eat. Weight loss. Bad breath. Blood in your poop (stool). In some cases, there are no symptoms. How is this treated? This condition is treated with medicines. The medicines that are used depend on what caused the condition. You may be given: Antibiotic medicine, if your condition was caused by an infection from germs. H2 blockers and similar medicines, if your condition was caused by too much acid in the stomach. Treatment may also include stopping the use of certain medicines, such as aspirin or ibuprofen. Follow these instructions at home: Medicines Take over-the-counter and prescription medicines only as told by your doctor. If you were  prescribed an antibiotic medicine, take it as told by your doctor. Do not stop taking it even if you start to feel better. Alcohol use Do not drink alcohol if: Your doctor tells you not to drink. You are pregnant, may be pregnant, or are planning to become pregnant. If you drink alcohol: Limit your use to: 0-1 drink a day for women. 0-2 drinks a day for men. Know how much alcohol is in your drink. In the [place] one drink equals one 12 oz bottle of beer (355 mL), one 5 oz glass of wine (148 mL), or one 1 oz glass of hard liquor (44 mL). General instructions  Eat small meals often, instead of large meals. Avoid foods and drinks that make you feel worse. Drink enough fluid to keep your pee (urine) pale yellow. Talk with your doctor about ways to manage stress. You can exercise or do deep breathing, meditation, or yoga. Do not smoke or use any products that contain nicotine or tobacco. If you need help quitting, ask your doctor. Keep all follow-up visits. Contact a doctor if: Your symptoms get worse. Your stomach pain gets worse. Your symptoms go away and then come back. You have a fever. Get help right away if: You vomit blood or something that looks like coffee grounds. You have black or dark red poop. You throw up any time you try to drink fluids. These symptoms may be an emergency. Get help right away. Call your local emergency services (911 in the [place] Do not wait to see if the symptoms will go away. Do not drive yourself to the hospital. Summary Gastritis is irritation and swelling (inflammation)  of the stomach. You must get help for this condition. If you do not get help, your stomach can bleed, and you can get sores (ulcers) in your stomach. You can be treated with medicines for germs or medicines to block too much acid in your stomach. This information is not intended to replace advice given to you by your health care provider. Make sure you discuss any questions you have  with your health care provider. Document Revised: 05/01/2020 Document Reviewed: 05/01/2020 Elsevier Patient Education  2023 [employer].

## 2021-09-20 NOTE — Progress Notes (Signed)
  Anita Mcgee is a 21 y.o. female with the following history as recorded in EpicCare:  Patient Active Problem List   Diagnosis Date Noted   Vasovagal syncope 10/20/2019   Poor posture 06/01/2016   Foot contusion 05/25/2016   Insect bite 05/05/2016   Rash 05/05/2016   Contraception management 04/20/2015   Irritable bowel 04/20/2015    Current Outpatient Medications  Medication Sig Dispense Refill   acetaminophen (TYLENOL) 325 MG tablet Take 650 mg by mouth every 6 (six) hours as needed.     Bismuth Subsalicylate (PEPTO-BISMOL) 262 MG TABS Take 1 tablet by mouth as needed.     famotidine (PEPCID) 20 MG tablet Take 1 tablet (20 mg total) by mouth 2 (two) times daily. 60 tablet 0   sertraline (ZOLOFT) 50 MG tablet TAKE 1 TABLET BY MOUTH ONCE A DAY 90 tablet 3   No current facility-administered medications for this visit.    Allergies: Ibuprofen  Past Medical History:  Diagnosis Date   Irritable bowel 04/20/2015   Migraine    Vasovagal syncope 10/20/2019    No past surgical history on file.  Family History  Problem Relation Age of Onset   Hypertension Father    Hypertension Maternal Grandmother    Diabetes Maternal Grandmother    Hypertension Paternal Grandfather    Heart disease Paternal Grandfather    Diabetes Paternal Grandfather    Stomach cancer Paternal Grandmother    Esophageal cancer Paternal Grandmother    Colon cancer Neg Hx    Pancreatic cancer Neg Hx    Liver disease Neg Hx     Social History   Tobacco Use   Smoking status: Every Day    Types: E-cigarettes    Start date: 2020   Smokeless tobacco: Never  Substance Use Topics   Alcohol use: No    Subjective:  Patient was seen in the ER last Friday with chest pain- exam in the ER was unremarkable; felt to be GI related- was given GI cocktail on Friday; admits she had eaten increased amount of junk food prior to onset of symptoms; has been discharged on Pepcid bid and is feeling better;  Notes that pain level  on Monday was at a 10- today down to 2; no vomiting, coffee grounds emesis or changes in bowel habits;     Objective:  Vitals:   09/20/21 1346  BP: 90/60  Pulse: 74  Temp: 98.4 F (36.9 C)  TempSrc: Oral  SpO2: 96%  Weight: 114 lb 3.2 oz (51.8 kg)  Height: 5\' 5"  (1.651 m)    General: Well developed, well nourished, in no acute distress  Skin : Warm and dry.  Head: Normocephalic and atraumatic  Lungs: Respirations unlabored;  Abdomen: Soft; nontender; nondistended; normoactive bowel sounds; no masses or hepatosplenomegaly  Musculoskeletal: No deformities; no active joint inflammation  Extremities: No edema, cyanosis, clubbing  Vessels: Symmetric bilaterally  Neurologic: Alert and oriented; speech intact; face symmetrical; moves all extremities well; CNII-XII intact without focal deficit   Assessment:  1. Acute gastritis without hemorrhage, unspecified gastritis type     Plan:  Improved; continue Pepcid bid for the next 2 weeks; no further follow up needed at this time.   Return for Our office number is [phone].  No orders of the defined types were placed in this encounter.   Requested Prescriptions    No prescriptions requested or ordered in this encounter

## 2021-10-03 DIAGNOSIS — R55 Syncope and collapse: Secondary | ICD-10-CM | POA: Diagnosis not present

## 2021-10-03 DIAGNOSIS — N939 Abnormal uterine and vaginal bleeding, unspecified: Secondary | ICD-10-CM | POA: Diagnosis not present

## 2021-10-03 DIAGNOSIS — N898 Other specified noninflammatory disorders of vagina: Secondary | ICD-10-CM | POA: Diagnosis not present

## 2021-10-03 DIAGNOSIS — Z124 Encounter for screening for malignant neoplasm of cervix: Secondary | ICD-10-CM | POA: Diagnosis not present

## 2021-10-10 ENCOUNTER — Other Ambulatory Visit (HOSPITAL_COMMUNITY): Payer: Self-pay

## 2021-10-10 MED ORDER — METRONIDAZOLE 500 MG PO TABS
500.0000 mg | ORAL_TABLET | Freq: Two times a day (BID) | ORAL | 0 refills | Status: AC
  Filled 2021-10-10: qty 14, 7d supply, fill #0

## 2021-10-27 DIAGNOSIS — N898 Other specified noninflammatory disorders of vagina: Secondary | ICD-10-CM | POA: Diagnosis not present

## 2021-10-27 DIAGNOSIS — R300 Dysuria: Secondary | ICD-10-CM | POA: Diagnosis not present

## 2021-10-27 DIAGNOSIS — Z681 Body mass index (BMI) 19 or less, adult: Secondary | ICD-10-CM | POA: Diagnosis not present

## 2021-10-27 DIAGNOSIS — N3001 Acute cystitis with hematuria: Secondary | ICD-10-CM | POA: Diagnosis not present

## 2021-12-08 DIAGNOSIS — J00 Acute nasopharyngitis [common cold]: Secondary | ICD-10-CM | POA: Diagnosis not present

## 2021-12-08 DIAGNOSIS — Z20822 Contact with and (suspected) exposure to covid-19: Secondary | ICD-10-CM | POA: Diagnosis not present

## 2021-12-08 DIAGNOSIS — R112 Nausea with vomiting, unspecified: Secondary | ICD-10-CM | POA: Diagnosis not present

## 2021-12-08 DIAGNOSIS — R5383 Other fatigue: Secondary | ICD-10-CM | POA: Diagnosis not present

## 2021-12-08 DIAGNOSIS — Z03818 Encounter for observation for suspected exposure to other biological agents ruled out: Secondary | ICD-10-CM | POA: Diagnosis not present

## 2021-12-11 DIAGNOSIS — J069 Acute upper respiratory infection, unspecified: Secondary | ICD-10-CM | POA: Diagnosis not present

## 2021-12-11 DIAGNOSIS — Z681 Body mass index (BMI) 19 or less, adult: Secondary | ICD-10-CM | POA: Diagnosis not present

## 2021-12-19 DIAGNOSIS — Z681 Body mass index (BMI) 19 or less, adult: Secondary | ICD-10-CM | POA: Diagnosis not present

## 2021-12-19 DIAGNOSIS — H1033 Unspecified acute conjunctivitis, bilateral: Secondary | ICD-10-CM | POA: Diagnosis not present

## 2021-12-21 DIAGNOSIS — R110 Nausea: Secondary | ICD-10-CM | POA: Diagnosis not present

## 2021-12-21 DIAGNOSIS — Z681 Body mass index (BMI) 19 or less, adult: Secondary | ICD-10-CM | POA: Diagnosis not present

## 2021-12-21 DIAGNOSIS — R197 Diarrhea, unspecified: Secondary | ICD-10-CM | POA: Diagnosis not present

## 2021-12-27 DIAGNOSIS — Z6820 Body mass index (BMI) 20.0-20.9, adult: Secondary | ICD-10-CM | POA: Diagnosis not present

## 2021-12-27 DIAGNOSIS — H02846 Edema of left eye, unspecified eyelid: Secondary | ICD-10-CM | POA: Diagnosis not present

## 2021-12-27 DIAGNOSIS — H02843 Edema of right eye, unspecified eyelid: Secondary | ICD-10-CM | POA: Diagnosis not present

## 2021-12-27 DIAGNOSIS — H1013 Acute atopic conjunctivitis, bilateral: Secondary | ICD-10-CM | POA: Diagnosis not present

## 2022-01-10 ENCOUNTER — Telehealth: Payer: Self-pay

## 2022-01-10 DIAGNOSIS — L259 Unspecified contact dermatitis, unspecified cause: Secondary | ICD-10-CM | POA: Diagnosis not present

## 2022-01-10 DIAGNOSIS — Z6820 Body mass index (BMI) 20.0-20.9, adult: Secondary | ICD-10-CM | POA: Diagnosis not present

## 2022-01-10 NOTE — Telephone Encounter (Signed)
Appt w/ PCP on 01/12/22

## 2022-01-10 NOTE — Telephone Encounter (Signed)
Nurse Assessment Nurse: Hassell Done, RN, Joelene Millin Date/Time Eilene Ghazi Time): 01/10/2022 7:09:11 AM Confirm and document reason for call. If symptomatic, describe symptoms. ---caller states she keeps waking up with swollen BL eye swelling, eyelid redness and the eyelid skin is peeling. she states her eyes are very itchy. she also c/o a headache for the past few days. Does the patient have any new or worsening symptoms? ---Yes Will a triage be completed? ---Yes Related visit to physician within the last 2 weeks? ---Yes Does the PT have any chronic conditions? (i.e. diabetes, asthma, this includes High risk factors for pregnancy, etc.) ---No Is the patient pregnant or possibly pregnant? (Ask all females between the ages of 65-55) ---No Is this a behavioral health or substance abuse call? ---No Guidelines Guideline Title Affirmed Question Affirmed Notes Nurse Date/Time (Eastern Time) Eye - Swelling Eyelid is red and painful (or tender to touch) Hassell Done, RN, Joelene Millin 01/10/2022 7:11:50 AM Disp. Time Eilene Ghazi Time) Disposition Final User 01/10/2022 7:15:36 AM See PCP within 24 Hours Yes Hassell Done, RN, Joelene Millin PLEASE NOTE: All timestamps contained within this report are represented as [place] Standard Time. CONFIDENTIALTY NOTICE: This fax transmission is intended only for the addressee. It contains information that is legally privileged, confidential or otherwise protected from use or disclosure. If you are not the intended recipient, you are strictly prohibited from reviewing, disclosing, copying using or disseminating any of this information or taking any action in reliance on or regarding this information. If you have received this fax in error, please notify us immediately by telephone so that we can arrange for its return to [place]. Phone: [phone], Toll-Free: [phone], Fax: [phone] Page: 2 of 2 Call Id: [id number] Final Disposition 01/10/2022 7:15:36 AM See PCP within 24 Hours Yes Hassell Done,  RN, Renea Ee Disagree/Comply Comply Caller Understands Yes PreDisposition Call Doctor Care Advice Given Per Guideline SEE PCP WITHIN 24 HOURS: * IF OFFICE WILL BE OPEN: You need to be examined within the next 24 hours. Call your doctor (or NP/PA) when the office opens and make an appointment. LOCAL COLD: * Apply a cool, wet washcloth to the area for 20 minutes. * You can repeat this every hour as needed. PAIN MEDICINES: * For pain relief, you can take either acetaminophen, ibuprofen, or naproxen. * They are over-the-counter (OTC) pain drugs. You can buy them at the drugstore. CALL BACK IF: * Fever occurs * You become worse CARE ADVICE given per Eye - Swelling (Adult) guideline. Referrals REFERRED TO PCP OFFICE

## 2022-01-12 ENCOUNTER — Ambulatory Visit: Payer: BC Managed Care – PPO | Admitting: Family

## 2022-01-17 ENCOUNTER — Encounter: Payer: Self-pay | Admitting: Family

## 2022-01-17 ENCOUNTER — Ambulatory Visit: Payer: BC Managed Care – PPO | Admitting: Family

## 2022-01-17 VITALS — BP 102/64 | HR 80 | Resp 18 | Ht 65.0 in | Wt 118.0 lb

## 2022-01-17 DIAGNOSIS — R7989 Other specified abnormal findings of blood chemistry: Secondary | ICD-10-CM | POA: Diagnosis not present

## 2022-01-17 DIAGNOSIS — H0289 Other specified disorders of eyelid: Secondary | ICD-10-CM | POA: Diagnosis not present

## 2022-01-17 NOTE — Progress Notes (Signed)
Anita Mcgee is a 22 y.o. female with the following history as recorded in EpicCare:  Patient Active Problem List   Diagnosis Date Noted   Vasovagal syncope 10/20/2019   Poor posture 06/01/2016   Foot contusion 05/25/2016   Insect bite 05/05/2016   Rash 05/05/2016   Contraception management 04/20/2015   Irritable bowel 04/20/2015    Current Outpatient Medications  Medication Sig Dispense Refill   acetaminophen (TYLENOL) 325 MG tablet Take 650 mg by mouth every 6 (six) hours as needed.     Bismuth Subsalicylate (PEPTO-BISMOL) 262 MG TABS Take 1 tablet by mouth as needed.     sertraline (ZOLOFT) 50 MG tablet TAKE 1 TABLET BY MOUTH ONCE A DAY 90 tablet 3   famotidine (PEPCID) 20 MG tablet Take 1 tablet (20 mg total) by mouth 2 (two) times daily. 60 tablet 0   No current facility-administered medications for this visit.    Allergies: Ibuprofen  Past Medical History:  Diagnosis Date   Irritable bowel 04/20/2015   Migraine    Vasovagal syncope 10/20/2019    No past surgical history on file.  Family History  Problem Relation Age of Onset   Hypertension Father    Hypertension Maternal Grandmother    Diabetes Maternal Grandmother    Hypertension Paternal Grandfather    Heart disease Paternal Grandfather    Diabetes Paternal Grandfather    Stomach cancer Paternal Grandmother    Esophageal cancer Paternal Grandmother    Colon cancer Neg Hx    Pancreatic cancer Neg Hx    Liver disease Neg Hx     Social History   Tobacco Use   Smoking status: Every Day    Types: E-cigarettes    Start date: 2020   Smokeless tobacco: Never  Substance Use Topics   Alcohol use: No    Subjective:  Seen at [facility] on 12/11 with suspected allergic conjunctivitis; went back to [facility] the next week with worsening symptoms of swelling- was given prednisone with limited benefit; 3rd visit to [facility] on 1/2 and told to continue taking allergy medication and follow up with PCP;  Had thyroid  labs with her GYN in September- TSH was noted to be abnormal; told to follow up with PCP;     Objective:  Vitals:   01/17/22 1555  BP: 102/64  Pulse: 80  Resp: 18  SpO2: 100%  Weight: 118 lb (53.5 kg)  Height: 5\' 5"  (1.651 m)    General: Well developed, well nourished, in no acute distress  Skin : Warm and dry. Erythema, scaling noted on upper eyelids;  Head: Normocephalic and atraumatic  Eyes: Sclera and conjunctiva clear; pupils round and reactive to light; extraocular movements intact  Ears: External normal; canals clear; tympanic membranes normal  Oropharynx: Pink, supple. No suspicious lesions  Neck: Supple without thyromegaly, adenopathy  Lungs: Respirations unlabored;  Neurologic: Alert and oriented; speech intact; face symmetrical; moves all extremities well; CNII-XII intact without focal deficit   Assessment:  1. Abnormal TSH   2. Irritation of eyelid     Plan:  ? If thyroid abnormality if contributing to skin abnormalities; will repeat TSH and thyroid ultrasound; will most likely start low dose of Synthroid; to consider dermatology evaluation if labs today are normal however;   No follow-ups on file.  Orders Placed This Encounter  Procedures   THYROID    Standing Status:   Future    Standing Expiration Date:   01/18/2023    Order Specific Question:  Reason for Exam (SYMPTOM  OR DIAGNOSIS REQUIRED)    Answer:   abnormal TSH    Order Specific Question:   Preferred imaging location?    Answer:   [facility]   Thyroid Panel With TSH    Requested Prescriptions    No prescriptions requested or ordered in this encounter

## 2022-01-18 ENCOUNTER — Other Ambulatory Visit: Payer: Self-pay

## 2022-01-18 ENCOUNTER — Other Ambulatory Visit: Payer: Self-pay | Admitting: Family

## 2022-01-18 ENCOUNTER — Other Ambulatory Visit (HOSPITAL_COMMUNITY): Payer: Self-pay

## 2022-01-18 LAB — THYROID PANEL WITH TSH
Free Thyroxine Index: 1.7 (ref 1.4–3.8)
T3 Uptake: 26 % (ref 22–35)
T4, Total: 6.5 ug/dL (ref 5.1–11.9)
TSH: 5.83 mIU/L — ABNORMAL HIGH

## 2022-01-18 MED ORDER — LEVOTHYROXINE SODIUM 25 MCG PO TABS
25.0000 ug | ORAL_TABLET | Freq: Every day | ORAL | 0 refills | Status: DC
  Filled 2022-01-18: qty 90, 90d supply, fill #0

## 2022-01-23 ENCOUNTER — Ambulatory Visit (INDEPENDENT_AMBULATORY_CARE_PROVIDER_SITE_OTHER): Payer: BC Managed Care – PPO

## 2022-01-23 DIAGNOSIS — R946 Abnormal results of thyroid function studies: Secondary | ICD-10-CM | POA: Diagnosis not present

## 2022-01-23 DIAGNOSIS — R7989 Other specified abnormal findings of blood chemistry: Secondary | ICD-10-CM | POA: Diagnosis not present

## 2022-01-30 DIAGNOSIS — J00 Acute nasopharyngitis [common cold]: Secondary | ICD-10-CM | POA: Diagnosis not present

## 2022-01-30 DIAGNOSIS — Z20828 Contact with and (suspected) exposure to other viral communicable diseases: Secondary | ICD-10-CM | POA: Diagnosis not present

## 2022-01-30 DIAGNOSIS — R5383 Other fatigue: Secondary | ICD-10-CM | POA: Diagnosis not present

## 2022-01-30 DIAGNOSIS — Z03818 Encounter for observation for suspected exposure to other biological agents ruled out: Secondary | ICD-10-CM | POA: Diagnosis not present

## 2022-01-30 DIAGNOSIS — Z20822 Contact with and (suspected) exposure to covid-19: Secondary | ICD-10-CM | POA: Diagnosis not present

## 2022-02-02 ENCOUNTER — Telehealth: Payer: Self-pay | Admitting: Family

## 2022-02-02 NOTE — Telephone Encounter (Signed)
Patient called to advise that she is supposed to be getting a CT scan done and hasn't heard anything back about it. CT was supposed to follow behind thyroid US. Please call patient once order has been placed.

## 2022-02-03 ENCOUNTER — Other Ambulatory Visit: Payer: Self-pay | Admitting: Family

## 2022-02-03 DIAGNOSIS — R591 Generalized enlarged lymph nodes: Secondary | ICD-10-CM

## 2022-02-03 DIAGNOSIS — R9389 Abnormal findings on diagnostic imaging of other specified body structures: Secondary | ICD-10-CM

## 2022-02-03 NOTE — Telephone Encounter (Signed)
Spoke with Pt she is aware the CT scan order has been placed.

## 2022-02-06 ENCOUNTER — Telehealth: Payer: Self-pay

## 2022-02-06 NOTE — Telephone Encounter (Signed)
Appt w/ PCP tomorrow.  

## 2022-02-06 NOTE — Telephone Encounter (Signed)
Nurse Assessment Nurse: Selena Batten, RN, Amy Date/Time Eilene Ghazi Time): 02/05/2022 11:23:20 AM Confirm and document reason for call. If symptomatic, describe symptoms. ---Caller states she had swelling and burning to her eyes and generalized itching that began in December, worsening over time. Caller states she has been seen by her PCP for this, was told it may have to do with her thyroid, was given medication (thyroxine) which has not helped. Does the patient have any new or worsening symptoms? ---Yes Will a triage be completed? ---Yes Related visit to physician within the last 2 weeks? ---Yes Does the PT have any chronic conditions? (i.e. diabetes, asthma, this includes High risk factors for pregnancy, etc.) ---Yes List chronic conditions. ---thyroid concerns, no official diagnosis yet per caller Is the patient pregnant or possibly pregnant? (Ask all females between the ages of 53-55) ---No Is this a behavioral health or substance abuse call? ---No Guidelines Guideline Title Affirmed Question Affirmed Notes Nurse Date/Time (Eastern Time) Itching - Widespread [1] MODERATESEVERE widespread itching (i.e., interferes with sleep, normal activities or school) Selena Batten, Therapist, sports, [place] 02/05/2022 11:26:25 AM PLEASE NOTE: All timestamps contained within this report are represented as [place] Standard Time. CONFIDENTIALTY NOTICE: This fax transmission is intended only for the addressee. It contains information that is legally privileged, confidential or otherwise protected from use or disclosure. If you are not the intended recipient, you are strictly prohibited from reviewing, disclosing, copying using or disseminating any of this information or taking any action in reliance on or regarding this information. If you have received this fax in error, please notify us immediately by telephone so that we can arrange for its return to [place]. Phone: [phone], Toll-Free: [phone], Fax: [phone] Page: 2 of  2 Call Id: [id number] Guidelines Guideline Title Affirmed Question Affirmed Notes Nurse Date/Time Eilene Ghazi Time) AND [2] not improved after 24 hours of itching Care Advice Eye - Swelling [1] SEVERE eyelid swelling (i.e., shut or almost) AND [2] involves both eyes AND [3] itchy Selena Batten, RN, Amy 02/05/2022 11:28:35 AM Disp. Time Eilene Ghazi Time) Disposition Final User 02/05/2022 11:02:00 AM Attempt made - message left Selena Batten RN, Amy 02/05/2022 11:27:41 AM See PCP within 24 Hours Selena Batten, RN, Amy 02/05/2022 11:32:55 AM See PCP within 24 Hours Yes Selena Batten, RN, Amy Final Disposition 02/05/2022 11:32:55 AM See PCP within 24 Hours Yes Selena Batten, RN, Amy Caller Disagree/Comply Comply Caller Understands Yes PreDisposition Did not know what to do Care Advice Given Per Guideline SEE PCP WITHIN 24 HOURS: * IF OFFICE WILL BE OPEN: You need to be examined within the next 24 hours. Call your doctor (or NP/PA) when the office opens and make an appointment. CALL BACK IF: * You become worse CARE ADVICE given per Itching, Widespread (Adult) guideline. SEE PCP WITHIN 24 HOURS: * IF OFFICE WILL BE OPEN: You need to be examined within the next 24 hours. Call your doctor (or NP/PA) when the office opens and make an appointment. CALL BACK IF: * You become worse * Fever occurs CARE ADVICE given per Eye - Swelling (Adult) guideline. * Apply a cool, wet washcloth to the area for 20 minutes. Comments User: Anita Seal, RN Date/Time Eilene Ghazi Time): 02/05/2022 11:27:26 AM caller states she was advised to use aquaphor and benadryl and this has not helped Referrals REFERRED TO PCP OFFICE

## 2022-02-07 ENCOUNTER — Encounter: Payer: Self-pay | Admitting: Family

## 2022-02-07 ENCOUNTER — Ambulatory Visit (HOSPITAL_BASED_OUTPATIENT_CLINIC_OR_DEPARTMENT_OTHER)
Admission: RE | Admit: 2022-02-07 | Discharge: 2022-02-07 | Disposition: A | Discharge status: Home / Self Care | Payer: BC Managed Care – PPO | Source: Ambulatory Visit | Attending: Family | Admitting: Family

## 2022-02-07 ENCOUNTER — Encounter (HOSPITAL_BASED_OUTPATIENT_CLINIC_OR_DEPARTMENT_OTHER): Payer: Self-pay

## 2022-02-07 ENCOUNTER — Ambulatory Visit: Payer: BC Managed Care – PPO | Admitting: Family

## 2022-02-07 VITALS — BP 110/62 | HR 75 | Ht 65.0 in | Wt 119.8 lb

## 2022-02-07 DIAGNOSIS — R7989 Other specified abnormal findings of blood chemistry: Secondary | ICD-10-CM

## 2022-02-07 DIAGNOSIS — R946 Abnormal results of thyroid function studies: Secondary | ICD-10-CM

## 2022-02-07 DIAGNOSIS — R591 Generalized enlarged lymph nodes: Secondary | ICD-10-CM | POA: Diagnosis not present

## 2022-02-07 DIAGNOSIS — R9389 Abnormal findings on diagnostic imaging of other specified body structures: Secondary | ICD-10-CM | POA: Insufficient documentation

## 2022-02-07 DIAGNOSIS — H01119 Allergic dermatitis of unspecified eye, unspecified eyelid: Secondary | ICD-10-CM | POA: Diagnosis not present

## 2022-02-07 DIAGNOSIS — R221 Localized swelling, mass and lump, neck: Secondary | ICD-10-CM | POA: Diagnosis not present

## 2022-02-07 MED ORDER — IOHEXOL 300 MG/ML  SOLN
100.0000 mL | Freq: Once | INTRAMUSCULAR | Status: AC | PRN
  Administered 2022-02-07: 75 mL via INTRAVENOUS

## 2022-02-07 NOTE — Progress Notes (Signed)
Anita Mcgee is a 22 y.o. female with the following history as recorded in [employer]:  Patient Active Problem List   Diagnosis Date Noted   Vasovagal syncope 10/20/2019   Poor posture 06/01/2016   Foot contusion 05/25/2016   Insect bite 05/05/2016   Rash 05/05/2016   Contraception management 04/20/2015   Irritable bowel 04/20/2015    Current Outpatient Medications  Medication Sig Dispense Refill   acetaminophen (TYLENOL) 325 MG tablet Take 650 mg by mouth every 6 (six) hours as needed.     Bismuth Subsalicylate (PEPTO-BISMOL) 262 MG TABS Take 1 tablet by mouth as needed.     levothyroxine (SYNTHROID) 25 MCG tablet Take 1 tablet (25 mcg total) by mouth daily before breakfast. 90 tablet 0   sertraline (ZOLOFT) 50 MG tablet TAKE 1 TABLET BY MOUTH ONCE A DAY 90 tablet 3   famotidine (PEPCID) 20 MG tablet Take 1 tablet (20 mg total) by mouth 2 (two) times daily. 60 tablet 0   No current facility-administered medications for this visit.    Allergies: Ibuprofen  Past Medical History:  Diagnosis Date   Irritable bowel 04/20/2015   Migraine    Vasovagal syncope 10/20/2019    No past surgical history on file.  Family History  Problem Relation Age of Onset   Hypertension Father    Hypertension Maternal Grandmother    Diabetes Maternal Grandmother    Hypertension Paternal Grandfather    Heart disease Paternal Grandfather    Diabetes Paternal Grandfather    Stomach cancer Paternal Grandmother    Esophageal cancer Paternal Grandmother    Colon cancer Neg Hx    Pancreatic cancer Neg Hx    Liver disease Neg Hx     Social History   Tobacco Use   Smoking status: Every Day    Types: E-cigarettes    Start date: 2020   Smokeless tobacco: Never  Substance Use Topics   Alcohol use: No    Subjective:   Continuing to struggle with rash around both of her eyelids; symptoms x 2 months; no response to oral steroids and/ topical steroids; denies any new soaps, foods, detergents or  medications.    Objective:  Vitals:   02/07/22 1538  BP: 110/62  Pulse: 75  SpO2: 99%  Weight: 119 lb 12.8 oz (54.3 kg)  Height: 5\' 5"  (1.651 m)    General: Well developed, well nourished, in no acute distress  Skin : Warm and dry.  Head: Normocephalic and atraumatic  Eyes: Sclera and conjunctiva clear; pupils round and reactive to light; extraocular movements intact; dry, scaly, flaking skin noted on bilateral upper eyelids;  Ears: External normal; canals clear; tympanic membranes normal  Oropharynx: Pink, supple. No suspicious lesions  Neck: Supple without thyromegaly, adenopathy  Lungs: Respirations unlabored;  Neurologic: Alert and oriented; speech intact; face symmetrical; moves all extremities well; CNII-XII intact without focal deficit   Assessment:  1. Abnormal TSH   2. Eyelid dermatitis, allergic/contact     Plan:  Update TSH today; patient had CT earlier today that was recommended after recent thyroid ultrasound; follow up to be determined; Symptom presentation is consistent with contact dermatitis but patient is not responding to any type of steroids or treatment options; will go ahead and refer to dermatology- to consider allergy consult as well.   No follow-ups on file.  Orders Placed This Encounter  Procedures   TSH   Ambulatory referral to Dermatology    Referral Priority:   Routine    Referral Type:  Consultation    Referral Reason:   Specialty Services Required    Requested Specialty:   Dermatology    Number of Visits Requested:   1    Requested Prescriptions    No prescriptions requested or ordered in this encounter

## 2022-02-08 ENCOUNTER — Other Ambulatory Visit (HOSPITAL_COMMUNITY): Payer: Self-pay

## 2022-02-08 ENCOUNTER — Other Ambulatory Visit: Payer: Self-pay | Admitting: Family

## 2022-02-08 ENCOUNTER — Other Ambulatory Visit: Payer: Self-pay

## 2022-02-08 DIAGNOSIS — R7989 Other specified abnormal findings of blood chemistry: Secondary | ICD-10-CM

## 2022-02-08 LAB — TSH: TSH: 5.5 u[IU]/mL (ref 0.35–5.50)

## 2022-02-08 MED ORDER — LEVOTHYROXINE SODIUM 50 MCG PO TABS
50.0000 ug | ORAL_TABLET | Freq: Every day | ORAL | 0 refills | Status: AC
  Filled 2022-02-08: qty 90, 90d supply, fill #0

## 2022-02-08 NOTE — Progress Notes (Signed)
Patient notified of results.

## 2022-02-10 ENCOUNTER — Other Ambulatory Visit (HOSPITAL_COMMUNITY): Payer: Self-pay

## 2022-03-21 DIAGNOSIS — Z789 Other specified health status: Secondary | ICD-10-CM | POA: Diagnosis not present

## 2022-03-21 DIAGNOSIS — R0981 Nasal congestion: Secondary | ICD-10-CM | POA: Diagnosis not present

## 2022-03-21 DIAGNOSIS — Z13220 Encounter for screening for lipoid disorders: Secondary | ICD-10-CM | POA: Diagnosis not present

## 2022-03-21 DIAGNOSIS — Z681 Body mass index (BMI) 19 or less, adult: Secondary | ICD-10-CM | POA: Diagnosis not present

## 2022-03-21 DIAGNOSIS — R051 Acute cough: Secondary | ICD-10-CM | POA: Diagnosis not present

## 2022-03-21 DIAGNOSIS — E079 Disorder of thyroid, unspecified: Secondary | ICD-10-CM | POA: Diagnosis not present

## 2022-03-23 ENCOUNTER — Other Ambulatory Visit: Payer: Self-pay

## 2022-03-23 DIAGNOSIS — L2089 Other atopic dermatitis: Secondary | ICD-10-CM | POA: Diagnosis not present

## 2022-03-23 DIAGNOSIS — L700 Acne vulgaris: Secondary | ICD-10-CM | POA: Diagnosis not present

## 2022-03-24 ENCOUNTER — Other Ambulatory Visit: Payer: Self-pay

## 2022-03-24 ENCOUNTER — Other Ambulatory Visit (HOSPITAL_COMMUNITY): Payer: Self-pay

## 2022-03-24 MED ORDER — ALCLOMETASONE DIPROPIONATE 0.05 % EX OINT
1.0000 | TOPICAL_OINTMENT | Freq: Two times a day (BID) | CUTANEOUS | 0 refills | Status: AC
  Filled 2022-03-24: qty 15, 10d supply, fill #0

## 2022-03-24 MED ORDER — TACROLIMUS 0.1 % EX OINT
1.0000 | TOPICAL_OINTMENT | Freq: Two times a day (BID) | CUTANEOUS | 2 refills | Status: AC
  Filled 2022-03-24: qty 60, 30d supply, fill #0

## 2022-03-27 ENCOUNTER — Other Ambulatory Visit: Payer: Self-pay

## 2022-03-27 ENCOUNTER — Other Ambulatory Visit (HOSPITAL_COMMUNITY): Payer: Self-pay

## 2022-03-27 MED ORDER — DESONIDE 0.05 % EX OINT
1.0000 | TOPICAL_OINTMENT | Freq: Two times a day (BID) | CUTANEOUS | 0 refills | Status: AC
  Filled 2022-03-27: qty 15, 8d supply, fill #0

## 2022-03-28 ENCOUNTER — Other Ambulatory Visit (HOSPITAL_COMMUNITY): Payer: Self-pay

## 2022-03-28 ENCOUNTER — Other Ambulatory Visit: Payer: Self-pay

## 2022-03-30 ENCOUNTER — Other Ambulatory Visit (HOSPITAL_COMMUNITY): Payer: Self-pay

## 2022-03-31 ENCOUNTER — Other Ambulatory Visit (HOSPITAL_COMMUNITY): Payer: Self-pay

## 2022-03-31 ENCOUNTER — Other Ambulatory Visit: Payer: Self-pay

## 2022-04-03 ENCOUNTER — Other Ambulatory Visit (HOSPITAL_COMMUNITY): Payer: Self-pay

## 2022-05-19 DIAGNOSIS — R319 Hematuria, unspecified: Secondary | ICD-10-CM | POA: Diagnosis not present

## 2022-05-19 DIAGNOSIS — N390 Urinary tract infection, site not specified: Secondary | ICD-10-CM | POA: Diagnosis not present

## 2022-05-19 DIAGNOSIS — Z3202 Encounter for pregnancy test, result negative: Secondary | ICD-10-CM | POA: Diagnosis not present

## 2022-05-20 DIAGNOSIS — R300 Dysuria: Secondary | ICD-10-CM | POA: Diagnosis not present

## 2022-06-30 ENCOUNTER — Ambulatory Visit
Admission: EM | Admit: 2022-06-30 | Discharge: 2022-06-30 | Disposition: A | Discharge status: Home / Self Care | Payer: BC Managed Care – PPO | Attending: Internal Medicine | Admitting: Internal Medicine

## 2022-06-30 DIAGNOSIS — R21 Rash and other nonspecific skin eruption: Secondary | ICD-10-CM

## 2022-06-30 MED ORDER — VALACYCLOVIR HCL 1 G PO TABS: 1000.0000 mg | ORAL_TABLET | Freq: Three times a day (TID) | ORAL | 0 refills | Status: AC

## 2022-06-30 NOTE — ED Provider Notes (Signed)
[facility]  Note:  This document was prepared using Conservation officer, historic buildings and may include unintentional dictation errors.  MRN: [id number] DOB: Jul 21, 2000  Subjective:   Anita Mcgee is a 22 y.o. female presenting for concerns about a painful rash that she first noticed over the genital area 3 days ago while in the pool.  Is wondering if she was bit by a bug.  Patient is sexually active with 1 female partner, does not use condoms for protection.  Denies fever, n/v, abdominal pain, pelvic pain, dysuria, urinary frequency, hematuria, vaginal discharge.    No current facility-administered medications for this encounter.  Current Outpatient Medications:    acetaminophen (TYLENOL) 325 MG tablet, Take 650 mg by mouth every 6 (six) hours as needed., Disp: , Rfl:    alclomethasone (ACLOVATE) 0.05 % ointment, Apply 1 Application topically 2 (two) times daily for up to 7-10., Disp: 15 g, Rfl: 0   Bismuth Subsalicylate (PEPTO-BISMOL) 262 MG TABS, Take 1 tablet by mouth as needed., Disp: , Rfl:    desonide (DESOWEN) 0.05 % ointment, Apply 1 Application topically 2 (two) times daily for up to 7-10 days., Disp: 15 g, Rfl: 0   famotidine (PEPCID) 20 MG tablet, Take 1 tablet (20 mg total) by mouth 2 (two) times daily., Disp: 60 tablet, Rfl: 0   levothyroxine (SYNTHROID) 50 MCG tablet, Take 1 tablet (50 mcg total) by mouth daily before breakfast., Disp: 90 tablet, Rfl: 0   sertraline (ZOLOFT) 50 MG tablet, Take 1 tablet (50 mg total) by mouth daily., Disp: 90 tablet, Rfl: 3   tacrolimus (PROTOPIC) 0.1 % ointment, Apply 1 Application topically 2 (two) times daily. REFRIGERATE if causes warming/stinging, Disp: 60 g, Rfl: 2   Allergies  Allergen Reactions   Ibuprofen Other (See Comments)    Makes headaches worse Migraine Headaches   Lactose Intolerance (Gi) Diarrhea and Nausea And Vomiting    Past Medical History:  Diagnosis Date   Irritable bowel 04/20/2015    Migraine    Vasovagal syncope 10/20/2019     No past surgical history on file.  Family History  Problem Relation Age of Onset   Hypertension Father    Hypertension Maternal Grandmother    Diabetes Maternal Grandmother    Hypertension Paternal Grandfather    Heart disease Paternal Grandfather    Diabetes Paternal Grandfather    Stomach cancer Paternal Grandmother    Esophageal cancer Paternal Grandmother    Colon cancer Neg Hx    Pancreatic cancer Neg Hx    Liver disease Neg Hx     Social History   Tobacco Use   Smoking status: Every Day    Types: E-cigarettes    Start date: 2020   Smokeless tobacco: Never  Vaping Use   Vaping Use: Every day   Start date: 01/09/2017   Substances: Nicotine  Substance Use Topics   Alcohol use: Yes    Comment: Socially   Drug use: No    ROS   Objective:   Vitals: BP 108/69 (BP Location: Left Arm)   Pulse 73   Temp 98.8 F (37.1 C) (Oral)   Resp 16   LMP 06/30/2022 (Exact Date)   SpO2 98%   Physical Exam Exam conducted with a chaperone present Restaurant manager, fast food).  Constitutional:      General: She is not in acute distress.    Appearance: Normal appearance. She is well-developed. She is not ill-appearing, toxic-appearing or diaphoretic.  HENT:  Head: Normocephalic and atraumatic.     Nose: Nose normal.     Mouth/Throat:     Mouth: Mucous membranes are moist.  Eyes:     General: No scleral icterus.       Right eye: No discharge.        Left eye: No discharge.     Extraocular Movements: Extraocular movements intact.  Cardiovascular:     Rate and Rhythm: Normal rate.  Pulmonary:     Effort: Pulmonary effort is normal.  Genitourinary:    Pubic Area: Rash present.    Skin:    General: Skin is warm and dry.  Neurological:     General: No focal deficit present.     Mental Status: She is alert and oriented to person, place, and time.  Psychiatric:        Mood and Affect: Mood normal.        Behavior: Behavior normal.       Assessment and Plan :   PDMP not reviewed this encounter.  1. Rash of genital area    Area is very suspicious for genital HSV.  Recommended treating empirically.  Viral cytology and vaginal cytology pending.  Patient was very anxious throughout the visit, ultimately did not have a vasovagal syncope.  Discussed differential and patient was agreeable to completing vaginal cytology.  Counseled patient on potential for adverse effects with medications prescribed/recommended today, ER and return-to-clinic precautions discussed, patient verbalized understanding.    Wallis Bamberg, [place] 07/01/22 [phone]

## 2022-06-30 NOTE — ED Triage Notes (Signed)
Pt reports she felt a "pinch in the coochie lip" when she was getting out of the pool 3 days ago. Pt think she was bit by a bug.The discomfort is worse when walking or moving.

## 2022-06-30 NOTE — ED Notes (Signed)
In with Mani, PA-C for exam 

## 2022-06-30 NOTE — Discharge Instructions (Addendum)
Please start valacyclovir to address a possible genital herpes rash. Your culture and vaginal testing will result on Monday. Hold off on having sex for now until this resolves and you have all your test results. We will update you then and change your medications.

## 2022-07-03 ENCOUNTER — Telehealth (HOSPITAL_COMMUNITY): Payer: Self-pay | Admitting: Emergency Medicine

## 2022-07-03 LAB — CERVICOVAGINAL ANCILLARY ONLY
Bacterial Vaginitis (gardnerella): NEGATIVE
Candida Glabrata: NEGATIVE
Candida Vaginitis: POSITIVE — AB
Chlamydia: NEGATIVE
Comment: NEGATIVE
Comment: NEGATIVE
Comment: NEGATIVE
Comment: NEGATIVE
Comment: NEGATIVE
Comment: NORMAL
Neisseria Gonorrhea: NEGATIVE
Trichomonas: NEGATIVE

## 2022-07-03 MED ORDER — FLUCONAZOLE 150 MG PO TABS: 150.0000 mg | ORAL_TABLET | Freq: Once | ORAL | 0 refills | Status: AC

## 2022-07-04 LAB — HSV CULTURE AND TYPING

## 2022-07-07 ENCOUNTER — Other Ambulatory Visit: Payer: Self-pay

## 2022-07-07 ENCOUNTER — Other Ambulatory Visit (HOSPITAL_COMMUNITY): Payer: Self-pay

## 2022-07-07 DIAGNOSIS — Z113 Encounter for screening for infections with a predominantly sexual mode of transmission: Secondary | ICD-10-CM | POA: Diagnosis not present

## 2022-07-07 DIAGNOSIS — N9489 Other specified conditions associated with female genital organs and menstrual cycle: Secondary | ICD-10-CM | POA: Diagnosis not present

## 2022-07-07 DIAGNOSIS — N898 Other specified noninflammatory disorders of vagina: Secondary | ICD-10-CM | POA: Diagnosis not present

## 2022-07-07 MED ORDER — METRONIDAZOLE 0.75 % VA GEL
1.0000 | Freq: Every day | VAGINAL | 0 refills | Status: DC
  Filled 2022-07-07: qty 70, 5d supply, fill #0

## 2022-07-10 ENCOUNTER — Other Ambulatory Visit (HOSPITAL_COMMUNITY): Payer: Self-pay

## 2023-01-10 DIAGNOSIS — Z8759 Personal history of other complications of pregnancy, childbirth and the puerperium: Secondary | ICD-10-CM

## 2023-01-10 HISTORY — DX: Personal history of other complications of pregnancy, childbirth and the puerperium: Z87.59

## 2023-01-17 ENCOUNTER — Other Ambulatory Visit: Payer: Self-pay

## 2023-01-17 ENCOUNTER — Encounter (HOSPITAL_BASED_OUTPATIENT_CLINIC_OR_DEPARTMENT_OTHER): Payer: Self-pay | Admitting: Emergency Medicine

## 2023-01-17 ENCOUNTER — Emergency Department (HOSPITAL_BASED_OUTPATIENT_CLINIC_OR_DEPARTMENT_OTHER)
Admission: EM | Admit: 2023-01-17 | Discharge: 2023-01-17 | Disposition: A | Discharge status: Home / Self Care | Payer: BC Managed Care – PPO | Attending: Emergency Medicine | Admitting: Emergency Medicine

## 2023-01-17 DIAGNOSIS — R110 Nausea: Secondary | ICD-10-CM

## 2023-01-17 DIAGNOSIS — R197 Diarrhea, unspecified: Secondary | ICD-10-CM | POA: Diagnosis not present

## 2023-01-17 DIAGNOSIS — Z79899 Other long term (current) drug therapy: Secondary | ICD-10-CM | POA: Insufficient documentation

## 2023-01-17 LAB — URINALYSIS, ROUTINE W REFLEX MICROSCOPIC
Bilirubin Urine: NEGATIVE
Glucose, UA: NEGATIVE mg/dL
Ketones, ur: 40 mg/dL — AB
Leukocytes,Ua: NEGATIVE
Nitrite: NEGATIVE
Protein, ur: NEGATIVE mg/dL
Specific Gravity, Urine: >=1.03 (ref 1.005–1.030)
pH: 5.5 (ref 5.0–8.0)

## 2023-01-17 LAB — URINALYSIS, MICROSCOPIC (REFLEX): WBC, UA: NONE SEEN WBC/hpf (ref 0–5)

## 2023-01-17 LAB — PREGNANCY, URINE: Preg Test, Ur: NEGATIVE

## 2023-01-17 MED ORDER — ONDANSETRON 4 MG PO TBDP
4.0000 mg | ORAL_TABLET | Freq: Once | ORAL | Status: AC
  Administered 2023-01-17: 4 mg via ORAL
  Filled 2023-01-17: qty 1

## 2023-01-17 MED ORDER — ONDANSETRON 4 MG PO TBDP
4.0000 mg | ORAL_TABLET | Freq: Three times a day (TID) | ORAL | 0 refills | Status: AC | PRN
Start: 2023-01-17 — End: ?
  Filled 2023-01-17 – 2023-01-18 (×2): qty 10, 4d supply, fill #0

## 2023-01-17 NOTE — ED Notes (Signed)

## 2023-01-17 NOTE — Discharge Instructions (Signed)
 Make sure you increase your fluid intake.  You can use the nausea medicine to help with this.  If you develop abdominal pain, vaginal bleeding that is not the time of your menstrual cycle, uncontrolled vomiting, or any other new/concerning symptoms then return to the ER or call 911.

## 2023-01-17 NOTE — ED Triage Notes (Signed)
 Reports had one positive preg test and 4 other negative tests today , she is here to have a confirmed test . .  Had nausea x 4 days .

## 2023-01-17 NOTE — ED Provider Notes (Signed)
  EMERGENCY DEPARTMENT AT [facility] Provider Note   CSN: [id number] Arrival date & time: 01/17/23  8177     History  Chief Complaint  Patient presents with   Possible Pregnancy    Anita Mcgee is a 23 y.o. female.  HPI 23 year old female presents with concern for possible pregnancy.  For the past 4-5 days she has been having nausea as well as a little bit of diarrhea.  No abdominal pain or vaginal bleeding.  She took a pregnancy test yesterday that was positive.  However she her last menstrual cycle was December 25 and she went to make sure so she took 4-5 more tests and these have all been negative.  She was advised to come to the ED for evaluation.  Home Medications Prior to Admission medications   Medication Sig Start Date End Date Taking? Authorizing Provider  ondansetron  (ZOFRAN -ODT) 4 MG disintegrating tablet Take 1 tablet (4 mg total) by mouth every 8 (eight) hours as needed for nausea or vomiting. 01/17/23  Yes Freddi Hamilton, MD  acetaminophen  (TYLENOL ) 325 MG tablet Take 650 mg by mouth every 6 (six) hours as needed.    [provider]  alclomethasone (ACLOVATE ) 0.05 % ointment Apply 1 Application topically 2 (two) times daily for up to 7-10. 03/23/22     Bismuth Subsalicylate (PEPTO-BISMOL) 262 MG TABS Take 1 tablet by mouth as needed.    [provider]  desonide  (DESOWEN ) 0.05 % ointment Apply 1 Application topically 2 (two) times daily for up to 7-10 days. 03/27/22     famotidine  (PEPCID ) 20 MG tablet Take 1 tablet (20 mg total) by mouth 2 (two) times daily. 09/16/21 10/16/21  Cottie Donnice PARAS, MD  levothyroxine  (SYNTHROID ) 50 MCG tablet Take 1 tablet (50 mcg total) by mouth daily before breakfast. 02/08/22   Jason Leita Repine, FNP  sertraline  (ZOLOFT ) 50 MG tablet Take 1 tablet (50 mg total) by mouth daily. 09/06/21   Jason Leita Repine, FNP  tacrolimus  (PROTOPIC ) 0.1 % ointment Apply 1 Application topically 2 (two) times daily.  REFRIGERATE if causes warming/stinging 03/23/22     valACYclovir  (VALTREX ) 1000 MG tablet Take 1 tablet (1,000 mg total) by mouth 3 (three) times daily. 06/30/22   Christopher Savannah, PA-C      Allergies    Ibuprofen and Lactose intolerance (gi)    Review of Systems   Review of Systems  Constitutional:  Negative for fever.  Gastrointestinal:  Positive for diarrhea and nausea. Negative for abdominal pain and vomiting.  Genitourinary:  Negative for vaginal bleeding.    Physical Exam Updated Vital Signs BP 119/83 (BP Location: Left Arm)   Pulse 99   Temp 98.3 F (36.8 C)   Resp 18   Wt 54.9 kg   LMP 01/03/2023 (Exact Date)   SpO2 99%   BMI 20.14 kg/m  Physical Exam Vitals and nursing note reviewed.  Constitutional:      General: She is not in acute distress.    Appearance: She is well-developed. She is not ill-appearing or diaphoretic.  HENT:     Head: Normocephalic and atraumatic.  Cardiovascular:     Rate and Rhythm: Normal rate and regular rhythm.     Heart sounds: Normal heart sounds.  Pulmonary:     Effort: Pulmonary effort is normal.     Breath sounds: Normal breath sounds.  Abdominal:     General: There is no distension.     Palpations: Abdomen is soft.     Tenderness:  There is no abdominal tenderness.  Skin:    General: Skin is warm and dry.  Neurological:     Mental Status: She is alert.     ED Results / Procedures / Treatments   Labs (all labs ordered are listed, but only abnormal results are displayed) Labs Reviewed  URINALYSIS, ROUTINE W REFLEX MICROSCOPIC - Abnormal; Notable for the following components:      Result Value   Hgb urine dipstick TRACE (*)    Ketones, ur 40 (*)    All other components within normal limits  URINALYSIS, MICROSCOPIC (REFLEX) - Abnormal; Notable for the following components:   Bacteria, UA FEW (*)    All other components within normal limits  PREGNANCY, URINE    EKG None  Radiology No results  found.  Procedures Procedures    Medications Ordered in ED Medications  ondansetron  (ZOFRAN -ODT) disintegrating tablet 4 mg (has no administration in time range)    ED Course/ Medical Decision Making/ A&P                                 Medical Decision Making Amount and/or Complexity of Data Reviewed Labs: ordered.  Risk Prescription drug management.   Patient's pregnancy test is negative.  I think this combined with multiple other urine pregnancy tests that are negative suggest that her first 1 was a false positive.  There are some mild ketones in her urine I did did stressed the importance of increasing her fluid intake and will give Zofran  to help with her nausea.  Could have a GI illness given the recent diarrhea as well.  Has a benign abdominal exam.  At this point I doubt false negative pregnancy test, ectopic, or other intra-abdominal/gynecologic emergency.  Patient will be given a Zofran  prescription and advised to follow-up with GYN, who she has already set up an appointment for the end of the month due to her menstrual cycle anyway.        Final Clinical Impression(s) / ED Diagnoses Final diagnoses:  Nausea    Rx / DC Orders ED Discharge Orders          Ordered    ondansetron  (ZOFRAN -ODT) 4 MG disintegrating tablet  Every 8 hours PRN        01/17/23 2121              Freddi Hamilton, MD 01/17/23 2151

## 2023-01-18 ENCOUNTER — Other Ambulatory Visit (HOSPITAL_COMMUNITY): Payer: Self-pay

## 2023-01-18 ENCOUNTER — Telehealth: Payer: Self-pay | Admitting: *Deleted

## 2023-01-18 ENCOUNTER — Other Ambulatory Visit: Payer: Self-pay

## 2023-01-18 NOTE — Transitions of Care (Post Inpatient/ED Visit) (Signed)
   01/18/2023  Name: Anita Mcgee MRN: [id number] DOB: July 26, 2000  Today's TOC FU Call Status: Today's TOC FU Call Status:: Unsuccessful Call (1st Attempt) Unsuccessful Call (1st Attempt) Date: 01/18/23  Attempted to reach the patient regarding the most recent Inpatient/ED visit.  Follow Up Plan: Additional outreach attempts will be made to reach the patient to complete the Transitions of Care (Post Inpatient/ED visit) call.   Signature Hollace Michelli, [facility]

## 2023-01-19 ENCOUNTER — Other Ambulatory Visit: Payer: Self-pay

## 2023-02-06 ENCOUNTER — Other Ambulatory Visit: Payer: Self-pay

## 2023-02-06 ENCOUNTER — Other Ambulatory Visit (HOSPITAL_COMMUNITY): Payer: Self-pay

## 2023-02-06 DIAGNOSIS — Z124 Encounter for screening for malignant neoplasm of cervix: Secondary | ICD-10-CM | POA: Diagnosis not present

## 2023-02-06 DIAGNOSIS — Z6821 Body mass index (BMI) 21.0-21.9, adult: Secondary | ICD-10-CM | POA: Diagnosis not present

## 2023-02-06 DIAGNOSIS — Z01419 Encounter for gynecological examination (general) (routine) without abnormal findings: Secondary | ICD-10-CM | POA: Diagnosis not present

## 2023-02-06 DIAGNOSIS — Z3201 Encounter for pregnancy test, result positive: Secondary | ICD-10-CM | POA: Diagnosis not present

## 2023-02-06 MED ORDER — VALACYCLOVIR HCL 500 MG PO TABS
500.0000 mg | ORAL_TABLET | Freq: Every day | ORAL | 3 refills | Status: AC
  Filled 2023-02-06 – 2023-02-13 (×3): qty 90, 90d supply, fill #0

## 2023-02-06 MED ORDER — CYCLOBENZAPRINE HCL 10 MG PO TABS
10.0000 mg | ORAL_TABLET | ORAL | 1 refills | Status: AC
  Filled 2023-02-06: qty 30, 30d supply, fill #0
  Filled 2023-02-13 (×4): qty 21, 21d supply, fill #0

## 2023-02-09 ENCOUNTER — Other Ambulatory Visit: Payer: Self-pay

## 2023-02-13 ENCOUNTER — Other Ambulatory Visit (HOSPITAL_COMMUNITY): Payer: Self-pay

## 2023-02-13 ENCOUNTER — Other Ambulatory Visit: Payer: Self-pay

## 2023-04-02 DIAGNOSIS — O019 Hydatidiform mole, unspecified: Secondary | ICD-10-CM | POA: Diagnosis not present

## 2023-04-02 DIAGNOSIS — O021 Missed abortion: Secondary | ICD-10-CM | POA: Diagnosis not present

## 2023-04-02 DIAGNOSIS — O3680X1 Pregnancy with inconclusive fetal viability, fetus 1: Secondary | ICD-10-CM | POA: Diagnosis not present

## 2023-04-02 DIAGNOSIS — Z3A00 Weeks of gestation of pregnancy not specified: Secondary | ICD-10-CM | POA: Diagnosis not present

## 2023-04-02 DIAGNOSIS — Z3689 Encounter for other specified antenatal screening: Secondary | ICD-10-CM | POA: Diagnosis not present

## 2023-04-04 DIAGNOSIS — Z886 Allergy status to analgesic agent status: Secondary | ICD-10-CM | POA: Diagnosis not present

## 2023-04-04 DIAGNOSIS — O020 Blighted ovum and nonhydatidiform mole: Secondary | ICD-10-CM | POA: Diagnosis not present

## 2023-04-15 IMAGING — US US RENAL
1 series · 14 of 25 positions shown · non-contrast
Comparison: None.

CLINICAL DATA: 20-year-old female with acute left flank pain since
1711 hours.

EXAM:
RENAL / URINARY TRACT ULTRASOUND COMPLETE

[Series 1: us renal · 14 of 28 slices shown]
[im 1/28]
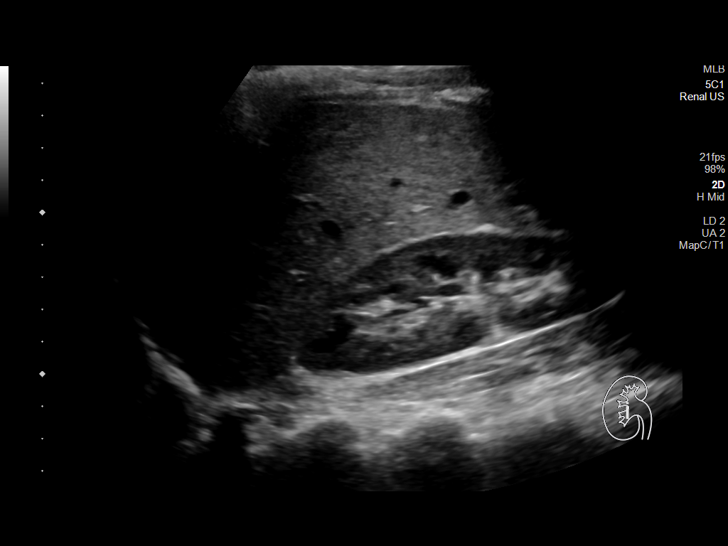
[im 3/28]
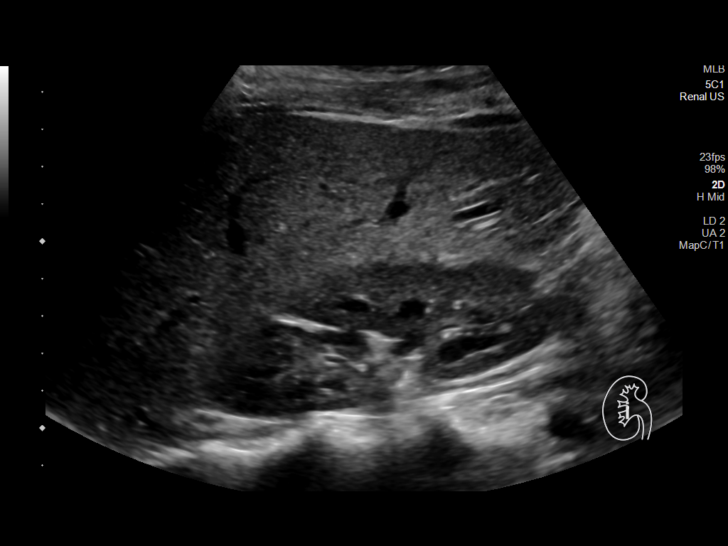
[im 5/28]
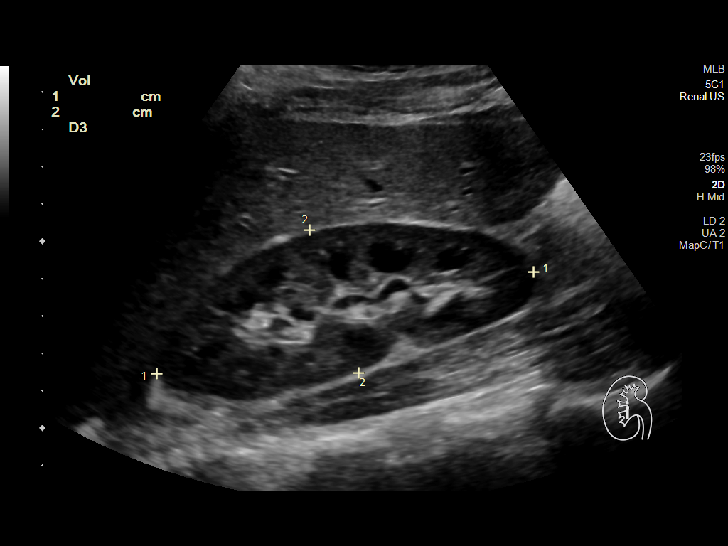
[im 7/28]
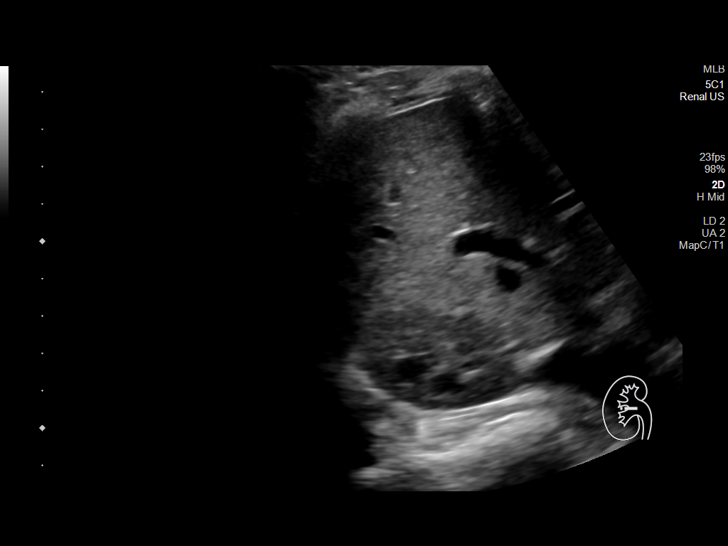
[im 10/28]
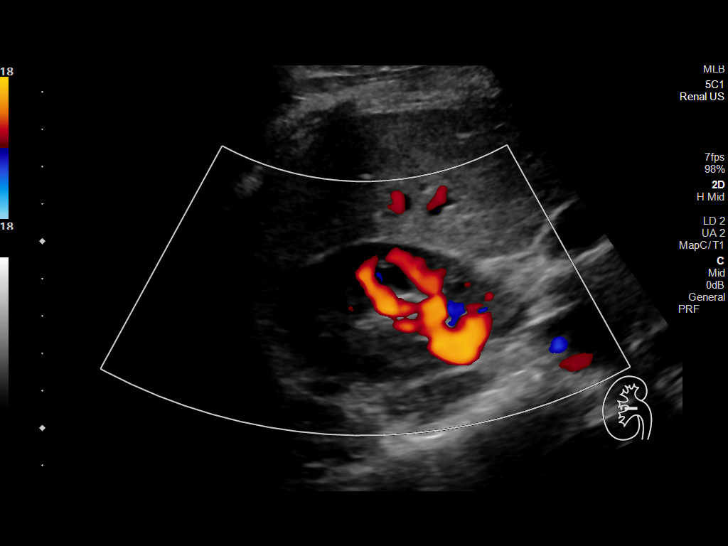
[im 11/28]
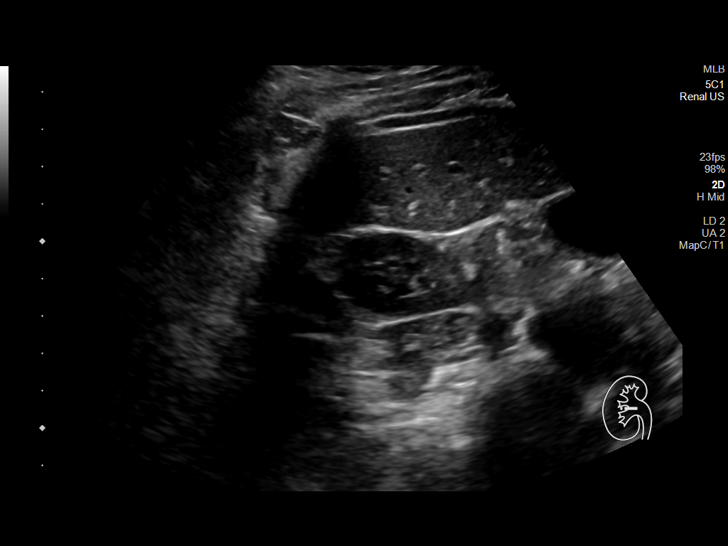
[im 13/28]
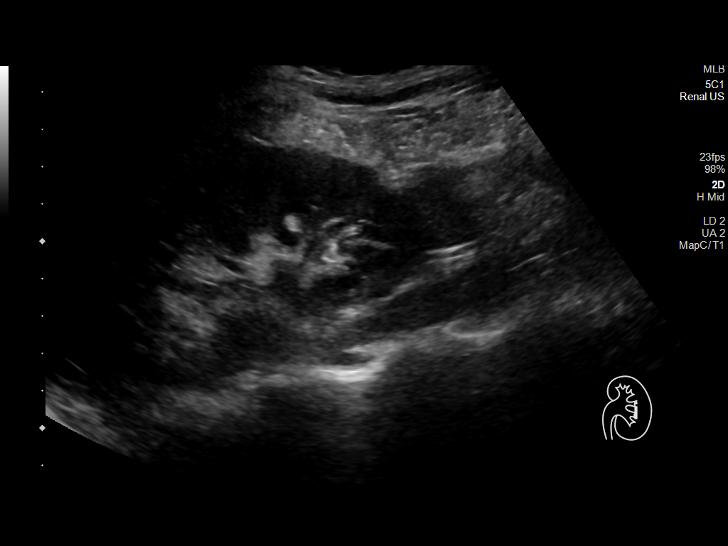
[im 15/28]
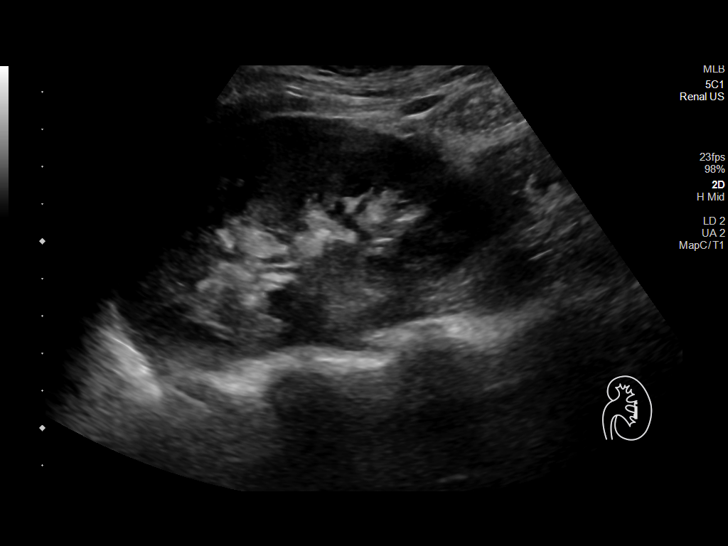
[im 17/28]
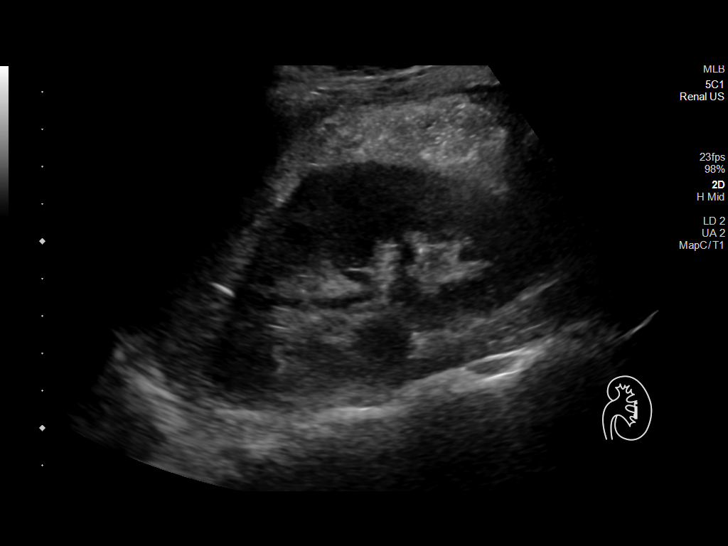
[im 19/28]
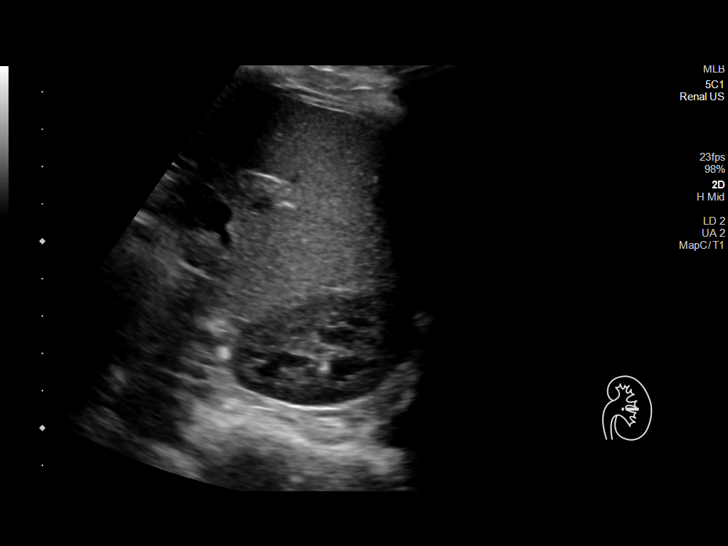
[im 21/28]
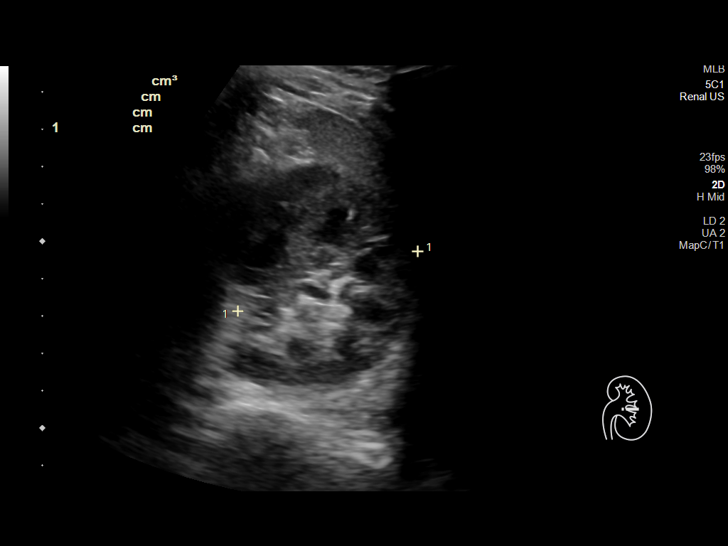
[im 23/28]
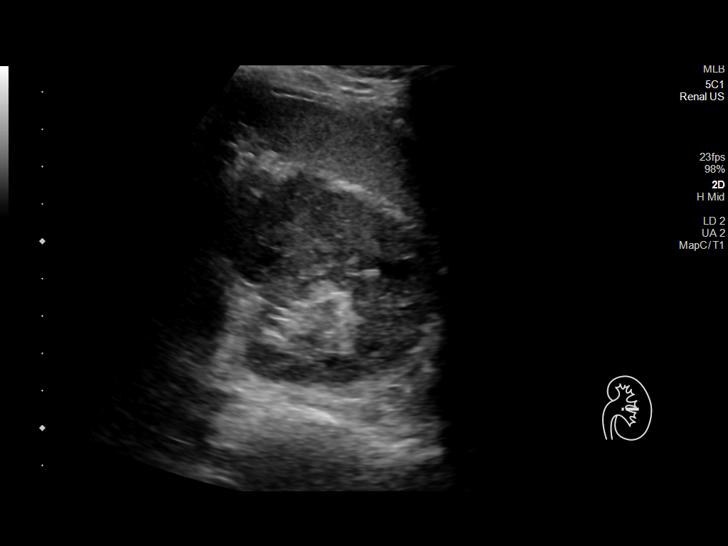
[im 25/28]
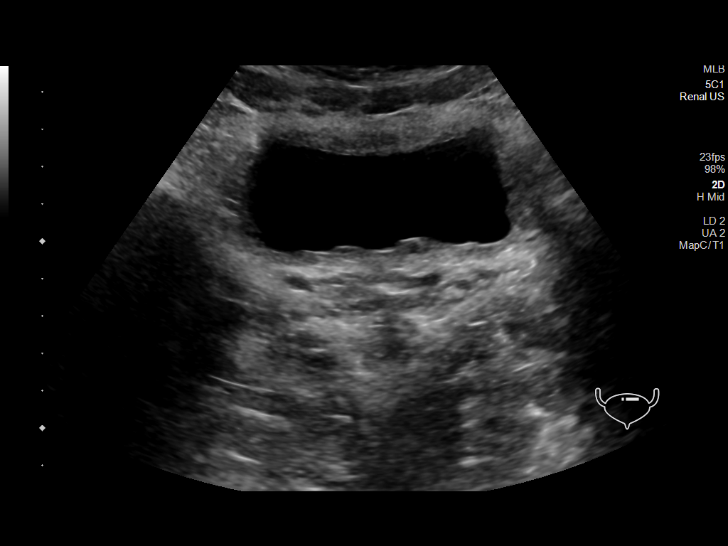
[im 28/28]
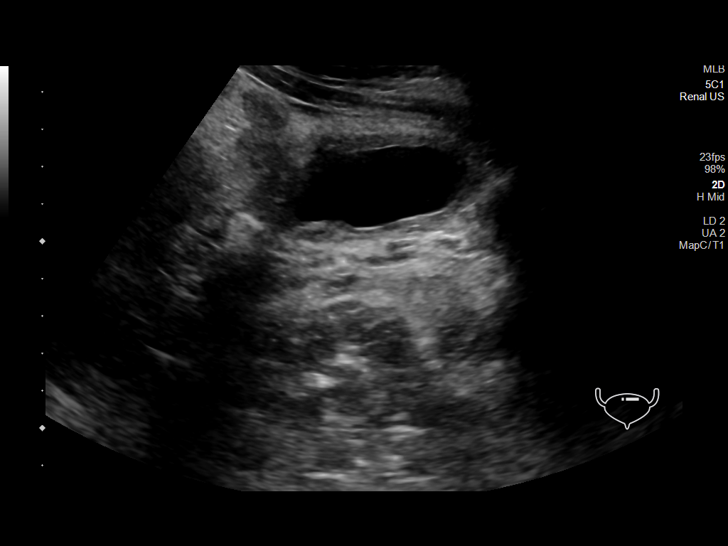

[14 of 25 positions shown; findings below may reference images not displayed]

FINDINGS: Right Kidney:

Renal measurements: 10.5 x 4.0 x 5.3 cm = volume: 118 mL.
Echogenicity within normal limits. No mass or hydronephrosis
visualized.

Left Kidney:

Renal measurements: 10.4 x 6.2 x 5.1 cm = volume: 171 mL.
Echogenicity within normal limits. No mass or hydronephrosis
visualized.

Bladder:

Appears normal for degree of bladder distention.

Other:

None.
IMPRESSION: Normal ultrasound appearance of both kidneys and the urinary
bladder.

## 2023-04-17 DIAGNOSIS — O020 Blighted ovum and nonhydatidiform mole: Secondary | ICD-10-CM | POA: Diagnosis not present

## 2023-04-26 DIAGNOSIS — Z9889 Other specified postprocedural states: Secondary | ICD-10-CM | POA: Diagnosis not present

## 2023-05-01 DIAGNOSIS — O020 Blighted ovum and nonhydatidiform mole: Secondary | ICD-10-CM | POA: Diagnosis not present

## 2023-05-15 DIAGNOSIS — O020 Blighted ovum and nonhydatidiform mole: Secondary | ICD-10-CM | POA: Diagnosis not present

## 2023-05-22 DIAGNOSIS — O020 Blighted ovum and nonhydatidiform mole: Secondary | ICD-10-CM | POA: Diagnosis not present

## 2023-05-29 DIAGNOSIS — O020 Blighted ovum and nonhydatidiform mole: Secondary | ICD-10-CM | POA: Diagnosis not present

## 2023-06-05 DIAGNOSIS — O020 Blighted ovum and nonhydatidiform mole: Secondary | ICD-10-CM | POA: Diagnosis not present

## 2023-06-12 DIAGNOSIS — O020 Blighted ovum and nonhydatidiform mole: Secondary | ICD-10-CM | POA: Diagnosis not present

## 2023-06-19 DIAGNOSIS — O020 Blighted ovum and nonhydatidiform mole: Secondary | ICD-10-CM | POA: Diagnosis not present

## 2023-07-19 DIAGNOSIS — O020 Blighted ovum and nonhydatidiform mole: Secondary | ICD-10-CM | POA: Diagnosis not present

## 2023-08-21 ENCOUNTER — Ambulatory Visit
Admission: EM | Admit: 2023-08-21 | Discharge: 2023-08-21 | Disposition: A | Discharge status: Home / Self Care | Attending: Family Medicine | Admitting: Family Medicine

## 2023-08-21 DIAGNOSIS — U071 COVID-19: Secondary | ICD-10-CM

## 2023-08-21 DIAGNOSIS — O020 Blighted ovum and nonhydatidiform mole: Secondary | ICD-10-CM | POA: Diagnosis not present

## 2023-08-21 LAB — POC COVID19/FLU A&B COMBO
Covid Antigen, POC: POSITIVE — AB
Influenza A Antigen, POC: NEGATIVE
Influenza B Antigen, POC: NEGATIVE

## 2023-08-21 MED ORDER — PROMETHAZINE-DM 6.25-15 MG/5ML PO SYRP
5.0000 mL | ORAL_SOLUTION | Freq: Three times a day (TID) | ORAL | 0 refills | Status: DC | PRN
Start: 2023-08-21 — End: 2023-09-19

## 2023-08-21 MED ORDER — ACETAMINOPHEN 325 MG PO TABS: 650.0000 mg | ORAL_TABLET | Freq: Four times a day (QID) | ORAL | 0 refills | Status: DC | PRN

## 2023-08-21 MED ORDER — PSEUDOEPHEDRINE HCL 30 MG PO TABS: 30.0000 mg | ORAL_TABLET | Freq: Three times a day (TID) | ORAL | 0 refills | Status: DC | PRN

## 2023-08-21 MED ORDER — CETIRIZINE HCL 10 MG PO TABS: 10.0000 mg | ORAL_TABLET | Freq: Every day | ORAL | 0 refills | Status: AC

## 2023-08-21 NOTE — ED Provider Notes (Signed)
[facility]  Note:  This document was prepared using Conservation officer, historic buildings and may include unintentional dictation errors.  MRN: [id number] DOB: 06/01/2000  Subjective:   Latriece Anstine is a 23 y.o. female presenting for 1 day history of sinus congestion, drainage, throat pain, coughing, headaches and body pains.  No fever, chest pain, shortness of breath, wheezing.  Patient vapes multiple times daily.  No history of asthma.  No current facility-administered medications for this encounter.  Current Outpatient Medications:    acetaminophen  (TYLENOL ) 325 MG tablet, Take 650 mg by mouth every 6 (six) hours as needed., Disp: , Rfl:    alclomethasone (ACLOVATE ) 0.05 % ointment, Apply 1 Application topically 2 (two) times daily for up to 7-10., Disp: 15 g, Rfl: 0   Bismuth Subsalicylate (PEPTO-BISMOL) 262 MG TABS, Take 1 tablet by mouth as needed., Disp: , Rfl:    cyclobenzaprine  (FLEXERIL ) 10 MG tablet, Insert 1 tablet (10 mg total) into vagina before intercourse, max 1 per day, Disp: 30 tablet, Rfl: 1   desonide  (DESOWEN ) 0.05 % ointment, Apply 1 Application topically 2 (two) times daily for up to 7-10 days., Disp: 15 g, Rfl: 0   famotidine  (PEPCID ) 20 MG tablet, Take 1 tablet (20 mg total) by mouth 2 (two) times daily., Disp: 60 tablet, Rfl: 0   levothyroxine  (SYNTHROID ) 50 MCG tablet, Take 1 tablet (50 mcg total) by mouth daily before breakfast., Disp: 90 tablet, Rfl: 0   ondansetron  (ZOFRAN -ODT) 4 MG disintegrating tablet, Take 1 tablet (4 mg total) by mouth every 8 (eight) hours as needed for nausea or vomiting., Disp: 10 tablet, Rfl: 0   sertraline  (ZOLOFT ) 50 MG tablet, Take 1 tablet (50 mg total) by mouth daily., Disp: 90 tablet, Rfl: 3   tacrolimus  (PROTOPIC ) 0.1 % ointment, Apply 1 Application topically 2 (two) times daily. REFRIGERATE if causes warming/stinging, Disp: 60 g, Rfl: 2   valACYclovir  (VALTREX ) 1000 MG tablet, Take 1 tablet (1,000 mg total)  by mouth 3 (three) times daily., Disp: 30 tablet, Rfl: 0   valACYclovir  (VALTREX ) 500 MG tablet, Take 1 tablet (500 mg total) by mouth daily., Disp: 90 tablet, Rfl: 3   Allergies  Allergen Reactions   Ibuprofen Other (See Comments)    Makes headaches worse Migraine Headaches   Lactose Intolerance (Gi) Diarrhea and Nausea And Vomiting    Past Medical History:  Diagnosis Date   Irritable bowel 04/20/2015   Migraine    Vasovagal syncope 10/20/2019     Past Surgical History:  Procedure Laterality Date   DILATION AND CURETTAGE OF UTERUS     per pt    Family History  Problem Relation Age of Onset   Hypertension Father    Hypertension Maternal Grandmother    Diabetes Maternal Grandmother    Hypertension Paternal Grandfather    Heart disease Paternal Grandfather    Diabetes Paternal Grandfather    Stomach cancer Paternal Grandmother    Esophageal cancer Paternal Grandmother    Colon cancer Neg Hx    Pancreatic cancer Neg Hx    Liver disease Neg Hx     Social History   Tobacco Use   Smoking status: Every Day    Types: E-cigarettes    Start date: 2020   Smokeless tobacco: Never  Vaping Use   Vaping status: Every Day   Start date: 01/09/2017   Substances: Nicotine  Substance Use Topics   Alcohol use: Yes    Comment: Socially   Drug use:  No    ROS   Objective:   Vitals: BP 121/79 (BP Location: Right Arm)   Pulse 80   Temp 98.2 F (36.8 C) (Oral)   Resp 16   LMP 08/15/2023   SpO2 98%   Physical Exam Constitutional:      General: She is not in acute distress.    Appearance: Normal appearance. She is well-developed and normal weight. She is not ill-appearing, toxic-appearing or diaphoretic.  HENT:     Head: Normocephalic and atraumatic.     Right Ear: Tympanic membrane, ear canal and external ear normal. No drainage or tenderness. No middle ear effusion. There is no impacted cerumen. Tympanic membrane is not erythematous or bulging.     Left Ear: Tympanic  membrane, ear canal and external ear normal. No drainage or tenderness.  No middle ear effusion. There is no impacted cerumen. Tympanic membrane is not erythematous or bulging.     Nose: Nose normal. No congestion or rhinorrhea.     Mouth/Throat:     Mouth: Mucous membranes are moist. No oral lesions.     Pharynx: No pharyngeal swelling, oropharyngeal exudate, posterior oropharyngeal erythema or uvula swelling.     Tonsils: No tonsillar exudate or tonsillar abscesses. 0 on the right. 0 on the left.     Comments: Thick streaks of postnasal drainage overlying pharynx.  Eyes:     General: No scleral icterus.       Right eye: No discharge.        Left eye: No discharge.     Extraocular Movements: Extraocular movements intact.     Right eye: Normal extraocular motion.     Left eye: Normal extraocular motion.     Conjunctiva/sclera: Conjunctivae normal.  Cardiovascular:     Rate and Rhythm: Normal rate and regular rhythm.     Heart sounds: Normal heart sounds. No murmur heard.    No friction rub. No gallop.  Pulmonary:     Effort: Pulmonary effort is normal. No respiratory distress.     Breath sounds: No stridor. No wheezing, rhonchi or rales.  Chest:     Chest wall: No tenderness.  Musculoskeletal:     Cervical back: Normal range of motion and neck supple.  Lymphadenopathy:     Cervical: No cervical adenopathy.  Skin:    General: Skin is warm and dry.  Neurological:     General: No focal deficit present.     Mental Status: She is alert and oriented to person, place, and time.  Psychiatric:        Mood and Affect: Mood normal.        Behavior: Behavior normal.     Results for orders placed or performed during the hospital encounter of 08/21/23 (from the past 24 hours)  POC Covid + Flu A/B Antigen     Status: Abnormal   Collection Time: 08/21/23  1:40 PM  Result Value Ref Range   Influenza A Antigen, POC Negative Negative   Influenza B Antigen, POC Negative Negative   Covid  Antigen, POC Positive (A) Negative    Assessment and Plan :   PDMP not reviewed this encounter.  1. COVID-19 virus infection    Patient has COVID-19 with minimal risk factors.  Physical exam findings reassuring and vital signs stable for discharge. Advised supportive care, offered symptomatic relief. Counseled patient on potential for adverse effects with medications prescribed/recommended today, ER and return-to-clinic precautions discussed, patient verbalized understanding.     Christopher Savannah, PA-C 08/21/23  1451  

## 2023-08-21 NOTE — Discharge Instructions (Addendum)
 We will manage this COVID 19 infection with supportive care. For sore throat or cough try using a honey-based tea. Use 3 teaspoons of honey with juice squeezed from half lemon. Place shaved pieces of ginger into 1/2-1 cup of water and warm over stove top. Then mix the ingredients and repeat every 4 hours as needed. Please take Tylenol  650mg  once every 6 hours for fevers, aches and pains. Hydrate very well with at least 2 liters (64 ounces) of water. Eat light meals such as soups (chicken and noodles, chicken wild rice, vegetable).  Do not eat any foods that you are allergic to.  Start an antihistamine like Zyrtec  (10mg  daily) for postnasal drainage, sinus congestion.  You can take this together with pseudoephedrine  (Sudafed) at a dose of 30 mg 3 times a day or twice daily as needed for the same kind of congestion. Use cough syrup as needed.

## 2023-08-21 NOTE — ED Triage Notes (Addendum)
 Pt c/o nasal congestion, runny nose, cough, sore throat, HA, body aches-sx started yesterday-unknown fever-no OTC meds PTA-NAD-steady gait

## 2023-08-27 IMAGING — US US PELVIS COMPLETE WITH TRANSVAGINAL
1 series · 14 of 25 positions shown · non-contrast
Comparison: None

CLINICAL DATA: Pelvic pain.

EXAM:
TRANSABDOMINAL AND TRANSVAGINAL ULTRASOUND OF PELVIS
TECHNIQUE: Both transabdominal and transvaginal ultrasound examinations of the
pelvis were performed. Transabdominal technique was performed for
global imaging of the pelvis including uterus, ovaries, adnexal
regions, and pelvic cul-de-sac. It was necessary to proceed with
endovaginal exam following the transabdominal exam to visualize the
uterus and ovaries.

[Series 1: us pelvis complete with transvaginal · 14 of 67 slices shown]
[im 1/67]
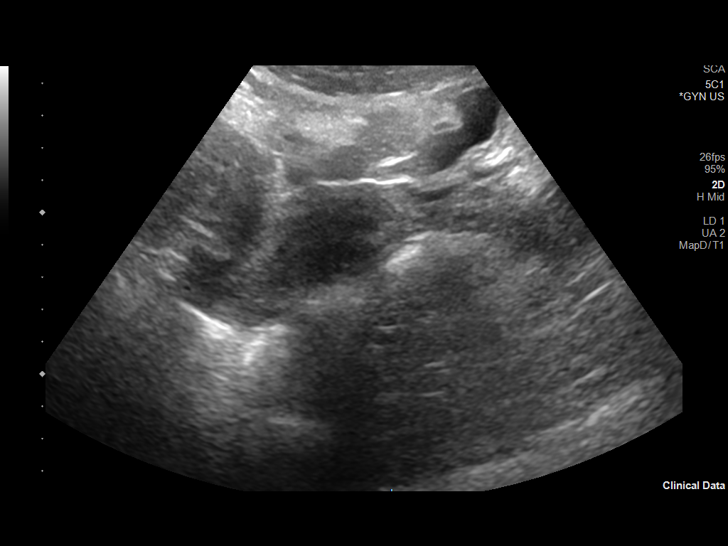
[im 6/67]
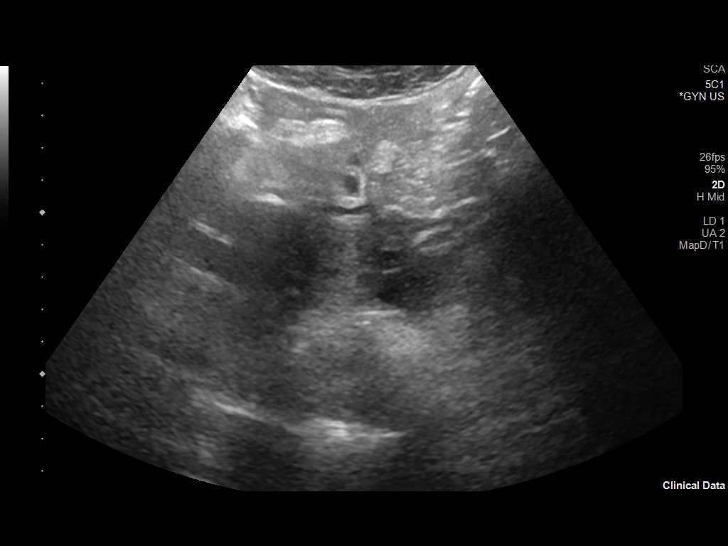
[im 12/67]
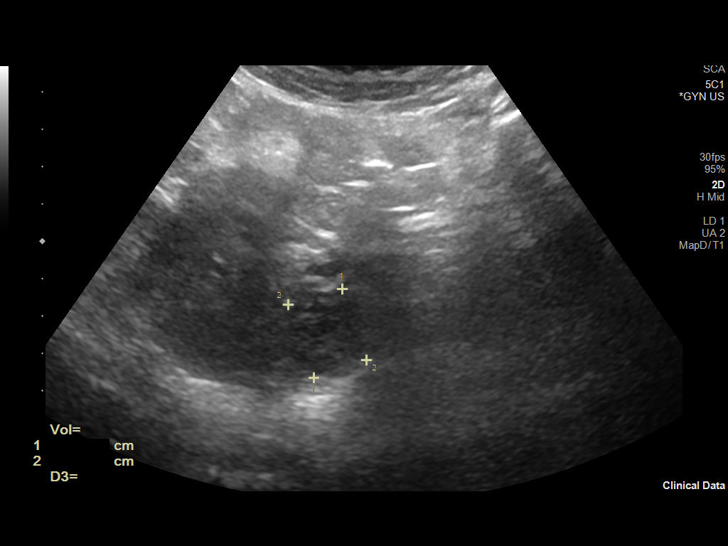
[im 17/67]
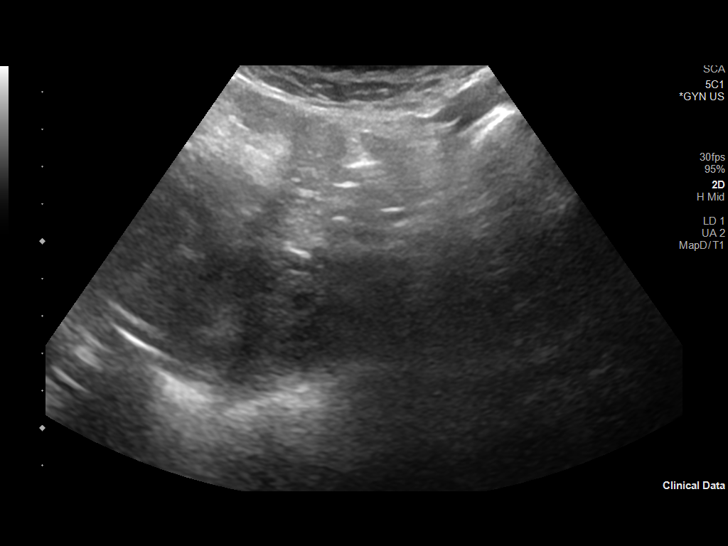
[im 23/67]
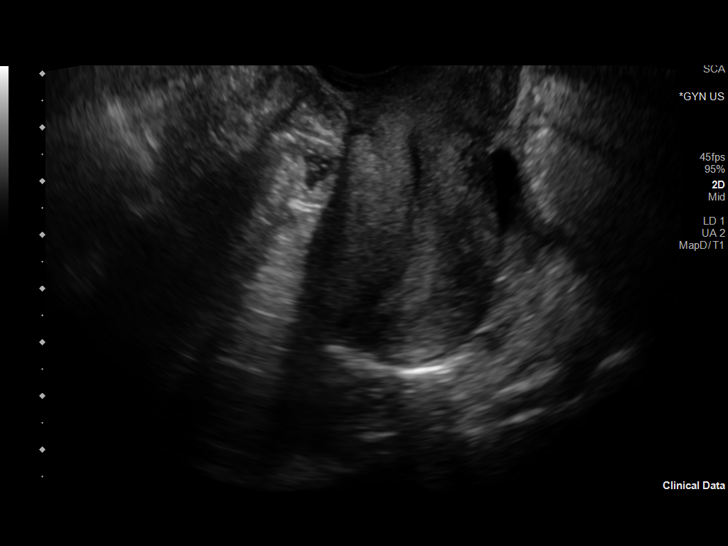
[im 25/67]
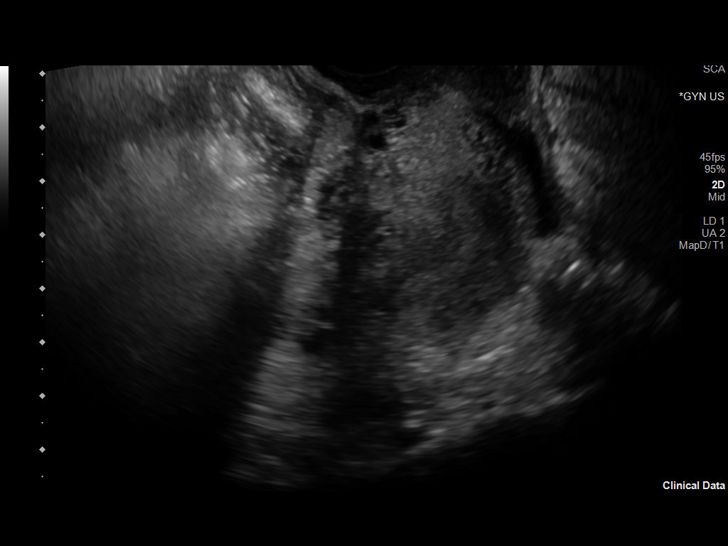
[im 31/67]
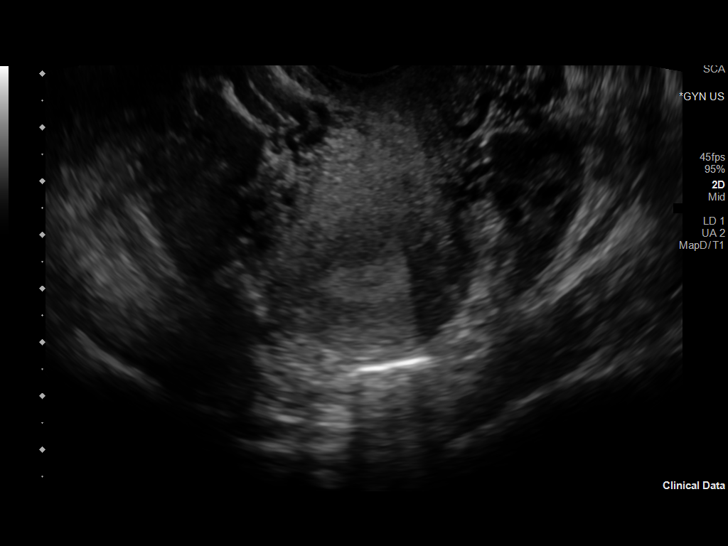
[im 36/67]
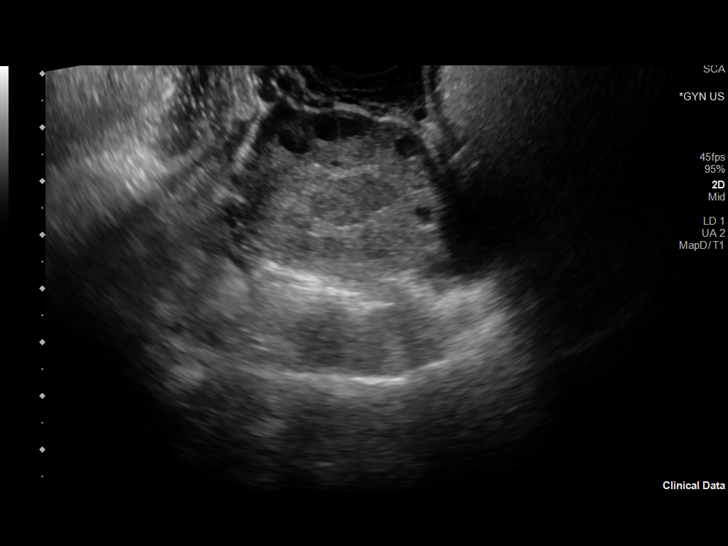
[im 42/67]
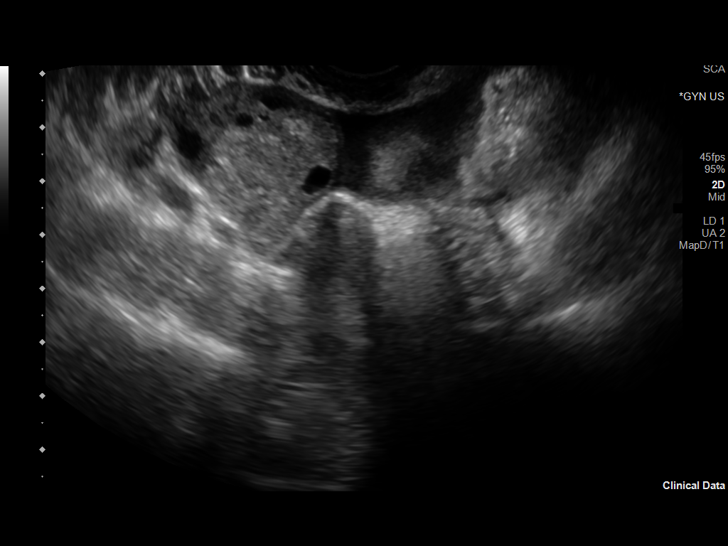
[im 45/67]
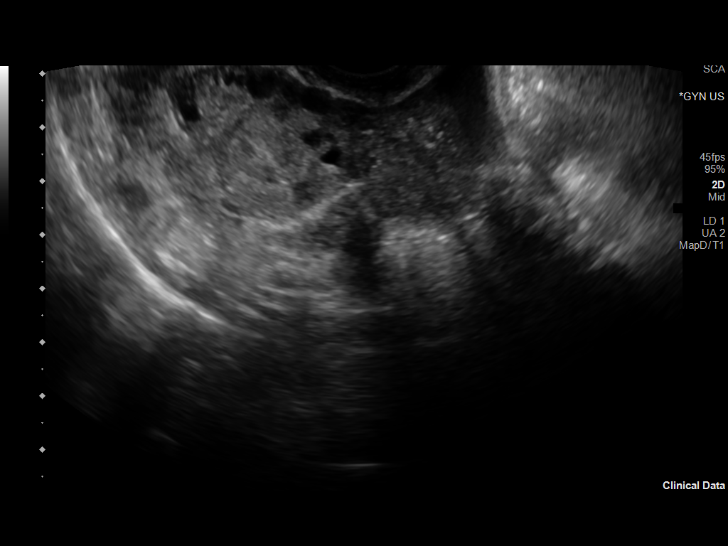
[im 50/67]
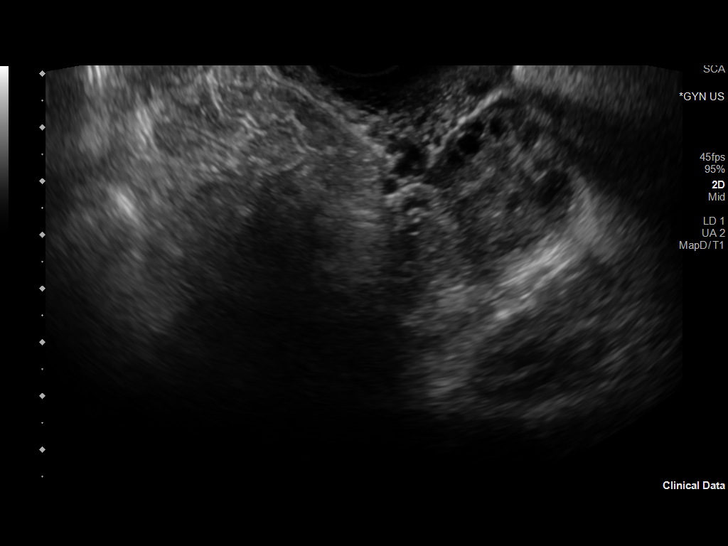
[im 56/67]
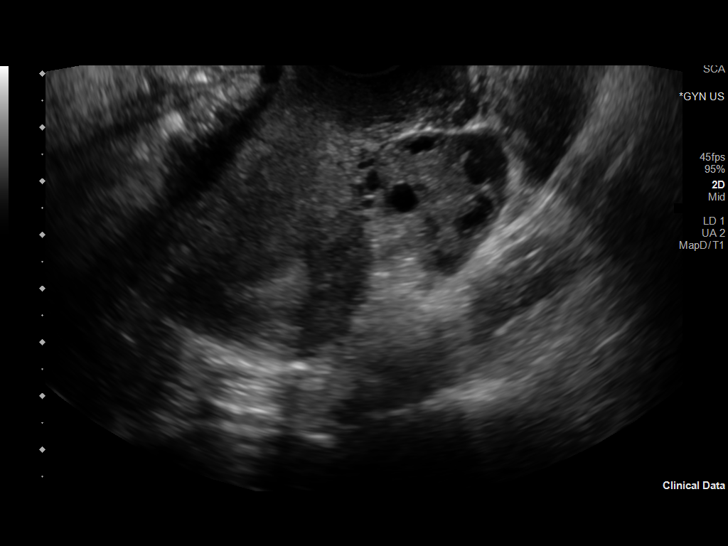
[im 61/67]
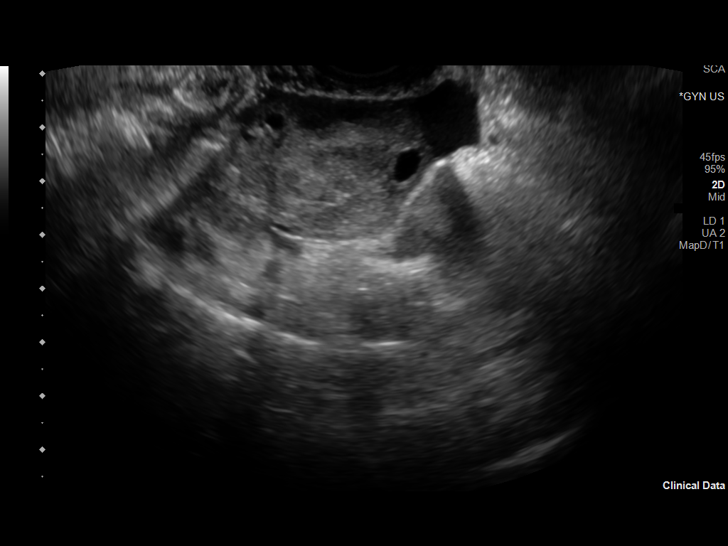
[im 67/67]
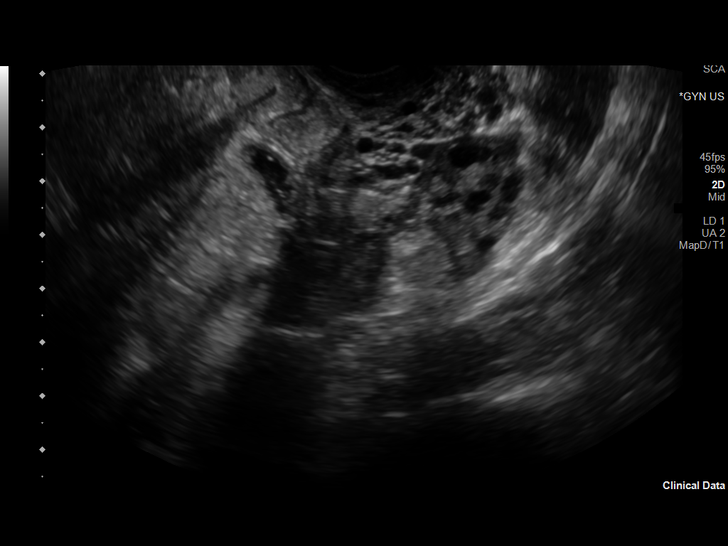

[14 of 25 positions shown; findings below may reference images not displayed]

FINDINGS: Uterus

Measurements: 6.3 x 3.2 x 4.1 cm = volume: 42 mL. No fibroids or
other mass visualized.

Endometrium

Thickness: 6 mm.  No focal abnormality visualized.

Right ovary

Measurements: 4.1 x 3.1 x 3.4 cm = volume: 22 mL. Normal
appearance/no adnexal mass.

Left ovary

Measurements: 3.7 x 2.9 x 2.3 cm = volume: 13 mL. Normal
appearance/no adnexal mass.

Other findings

Small volume pelvic free fluid, potentially physiologic.
IMPRESSION: Negative pelvic ultrasound.

## 2023-09-03 ENCOUNTER — Encounter: Payer: Self-pay | Admitting: Gastroenterology

## 2023-09-14 DIAGNOSIS — N939 Abnormal uterine and vaginal bleeding, unspecified: Secondary | ICD-10-CM | POA: Diagnosis not present

## 2023-09-14 DIAGNOSIS — O020 Blighted ovum and nonhydatidiform mole: Secondary | ICD-10-CM | POA: Diagnosis not present

## 2023-09-14 DIAGNOSIS — Z8759 Personal history of other complications of pregnancy, childbirth and the puerperium: Secondary | ICD-10-CM | POA: Diagnosis not present

## 2023-09-18 ENCOUNTER — Encounter: Payer: Self-pay | Admitting: Student

## 2023-09-18 NOTE — Progress Notes (Unsigned)
   Acute Office Visit  Subjective:     Patient ID: Anita Mcgee, female    DOB: August 19, 2000, 23 y.o.   MRN: [id number]  No chief complaint on file.   HPI Patient is in today for dizziness and acute vaginal bleeding. Pt reports using 2 pads per day. She is followed by OBGYN and reported dizziness, room spinning. Patient reports that LMP was 9/5 and she is concerned that she is anemic from the amount of bleeding that she is having. Patient has been followed by OBGYN  for history of molar pregnancy for which her most recent H CG was 1 on 09/14/2023.  Patient denies fever, chills, SOB, CP, palpitations, dyspnea, edema, HA, vision changes, N/V/D, abdominal pain, urinary symptoms, rash, weight changes, and recent illness or hospitalizations.   ROS  See HPI       Objective:    LMP 08/15/2023  {Vitals History (Optional):23777}  Physical Exam   Physical Exam Constitutional:      General: She is not in acute distress.    Appearance: Normal appearance. She is well-developed. She is not toxic-appearing.  HENT:     Head: Normocephalic and atraumatic.     Right Ear: External ear normal. TM-WNL    Left Ear: External ear normal. TM-WNL    Nose: Nose normal. No drainage.  Eyes:     General:        Right eye: No discharge.        Left eye: No discharge.     Conjunctiva/sclera: Conjunctivae normal.  Neck:     Thyroid : No thyromegaly.  Cardiovascular:     Rate and Rhythm: Normal rate and regular rhythm.     Heart sounds: Normal heart sounds. No murmur heard. Pulmonary:     Effort: Pulmonary effort is normal. No respiratory distress.     Breath sounds: Normal breath sounds. CTAB.  Abdominal:     Bowel sounds are normal, Abdomen soft. No abdominal tenderness.  Musculoskeletal:        General: Normal range of motion.     Cervical back: Neck supple.  Lymphadenopathy:     Cervical: No cervical adenopathy.  Skin:    General: Skin is warm and dry.  Neurological:     Mental Status: She is  alert and oriented to person, place, and time.  Psychiatric:        Mood and Affect: Mood normal.        Behavior: Behavior normal.        Thought Content: Thought content normal.        Judgment: Judgment normal.   No results found for any visits on 09/19/23.      Assessment & Plan:   Problem List Items Addressed This Visit   None   No orders of the defined types were placed in this encounter.   No follow-ups on file.  Deborrah Mabin L Elaine Middleton, NP

## 2023-09-19 ENCOUNTER — Encounter: Payer: Self-pay | Admitting: Student

## 2023-09-19 ENCOUNTER — Ambulatory Visit: Admitting: Student

## 2023-09-19 VITALS — BP 102/70 | HR 75 | Temp 98.2°F | Resp 12 | Ht 65.0 in | Wt 113.8 lb

## 2023-09-19 DIAGNOSIS — Z308 Encounter for other contraceptive management: Secondary | ICD-10-CM

## 2023-09-19 DIAGNOSIS — R7989 Other specified abnormal findings of blood chemistry: Secondary | ICD-10-CM

## 2023-09-19 DIAGNOSIS — N939 Abnormal uterine and vaginal bleeding, unspecified: Secondary | ICD-10-CM | POA: Diagnosis not present

## 2023-09-19 DIAGNOSIS — R42 Dizziness and giddiness: Secondary | ICD-10-CM | POA: Diagnosis not present

## 2023-09-19 DIAGNOSIS — F419 Anxiety disorder, unspecified: Secondary | ICD-10-CM

## 2023-09-19 LAB — COMPREHENSIVE METABOLIC PANEL WITH GFR
ALT: 12 U/L (ref 0–35)
AST: 16 U/L (ref 0–37)
Albumin: 5.1 g/dL (ref 3.5–5.2)
Alkaline Phosphatase: 44 U/L (ref 39–117)
BUN: 7 mg/dL (ref 6–23)
CO2: 26 meq/L (ref 19–32)
Calcium: 10.3 mg/dL (ref 8.4–10.5)
Chloride: 103 meq/L (ref 96–112)
Creatinine, Ser: 0.83 mg/dL (ref 0.40–1.20)
GFR: 99.47 mL/min (ref >60.00)
Glucose, Bld: 84 mg/dL (ref 70–99)
Potassium: 3.8 meq/L (ref 3.5–5.1)
Sodium: 137 meq/L (ref 135–145)
Total Bilirubin: 0.7 mg/dL (ref 0.2–1.2)
Total Protein: 8.1 g/dL (ref 6.0–8.3)

## 2023-09-19 LAB — CBC WITH DIFFERENTIAL/PLATELET
Basophils Absolute: 0.1 K/uL (ref 0.0–0.1)
Basophils Relative: 1.7 % (ref 0.0–3.0)
Eosinophils Absolute: 0.1 K/uL (ref 0.0–0.7)
Eosinophils Relative: 1.1 % (ref 0.0–5.0)
HCT: 38.2 % (ref 36.0–46.0)
Hemoglobin: 12.4 g/dL (ref 12.0–15.0)
Lymphocytes Relative: 20.9 % (ref 12.0–46.0)
Lymphs Abs: 1 K/uL (ref 0.7–4.0)
MCHC: 32.5 g/dL (ref 30.0–36.0)
MCV: 81.9 fl (ref 78.0–100.0)
Monocytes Absolute: 0.4 K/uL (ref 0.1–1.0)
Monocytes Relative: 8.4 % (ref 3.0–12.0)
Neutro Abs: 3.2 K/uL (ref 1.4–7.7)
Neutrophils Relative %: 67.9 % (ref 43.0–77.0)
Platelets: 326 K/uL (ref 150.0–400.0)
RBC: 4.67 Mil/uL (ref 3.87–5.11)
RDW: 14.5 % (ref 11.5–15.5)
WBC: 4.8 K/uL (ref 4.0–10.5)

## 2023-09-19 LAB — IRON,TIBC AND FERRITIN PANEL
%SAT: 14 % — ABNORMAL LOW (ref 16–45)
Ferritin: 10 ng/mL — ABNORMAL LOW (ref 16–154)
Iron: 57 ug/dL (ref 40–190)
TIBC: 405 ug/dL (ref 250–450)

## 2023-09-19 LAB — TSH: TSH: 3.92 u[IU]/mL (ref 0.35–5.50)

## 2023-09-19 LAB — VITAMIN B12: Vitamin B-12: 369 pg/mL (ref 211–911)

## 2023-09-19 MED ORDER — NORETHINDRONE ACET-ETHINYL EST 1-20 MG-MCG PO TABS: 1.0000 | ORAL_TABLET | Freq: Every day | ORAL | 6 refills | Status: AC

## 2023-09-19 MED ORDER — SERTRALINE HCL 50 MG PO TABS: 50.0000 mg | ORAL_TABLET | Freq: Every day | ORAL | 3 refills | Status: AC

## 2023-09-19 NOTE — Assessment & Plan Note (Signed)
 Increased anxiety and mood changes since stopping sertraline . Life stressors and potential thyroid  dysfunction may contribute. - Prescribe sertraline , starting at 25 mg for one week, then increase to 50 mg. - Schedule follow-up in 6 weeks to assess response and progress.

## 2023-09-19 NOTE — Assessment & Plan Note (Signed)
 Check lab work as ordered. Anemia is likely related to heavy menstrual bleeding; however, the patient has also been off her thyroid  medications, which may be contributing. Will review results and adjust treatment accordingly.

## 2023-09-19 NOTE — Assessment & Plan Note (Signed)
 Symptoms of fatigue, dizziness, and mood changes. Last TSH borderline high. Pt reports not taking Synthroid  r/t nausea. Discussed alternative treatment with [facility]our Thyroid , she would prefer to switch to this. - Order thyroid  function tests. - Consider [facility] Thyroid  if indicated. - Discussed taking Synthroid  on an empty stomach for best absorption, but okay with food if nausea occurs.

## 2023-09-20 ENCOUNTER — Ambulatory Visit: Payer: Self-pay | Admitting: Student

## 2023-09-20 MED ORDER — IRON (FERROUS SULFATE) 325 (65 FE) MG PO TABS: 325.0000 mg | ORAL_TABLET | Freq: Every day | ORAL | 0 refills | Status: AC

## 2023-09-21 DIAGNOSIS — O020 Blighted ovum and nonhydatidiform mole: Secondary | ICD-10-CM | POA: Diagnosis not present

## 2023-09-27 ENCOUNTER — Ambulatory Visit: Payer: Self-pay

## 2023-09-27 NOTE — Telephone Encounter (Signed)
 FYI Only or Action Required?: Action required by provider: patient started on birth control on 9/12 to help with heavy menstruation. Patient states yesterday she started having vaginal bleeding. Patient to call her OB today.  Patient was last seen in primary care on 09/19/2023 by Wheeler Harlene CROME, NP.  Called Nurse Triage reporting Vaginal Bleeding.  Symptoms began yesterday.  Interventions attempted: Rest, hydration, or home remedies.  Symptoms are: unchanged.  Triage Disposition: See HCP Within 4 Hours (Or PCP Triage)  Patient/caregiver understands and will follow disposition?: Yes  Copied from CRM #[id number]. Topic: Clinical - Medical Advice >> Sep 27, 2023  3:42 PM Pinkey ORN wrote: Reason for CRM: Medical Advice >> Sep 27, 2023  3:46 PM Pinkey ORN wrote: Patient states since starting her birth control she's started bleeding again and it's a big more than it was previously. Patient is unsure of what to do and is requesting a call back.  Reason for Disposition  [1] Bleeding or spotting between regular periods AND [2] occurs more than three cycles (3 months) AND [3] using birth control medicine (pills, patch, Depo-Provera, Implanon, vaginal ring, Mirena IUD)  Answer Assessment - Initial Assessment Questions 1. BLEEDING SEVERITY: Describe the bleeding that you are having. How much bleeding is there?      Spotting started yesterday 2. ONSET: When did the bleeding begin? Is it continuing now?     Started yesterday morning-last day of period was 09/21/2023.  3. MENSTRUAL PERIOD: When was the last normal menstrual period? How is this different than your period?     Finished period on 09/21/2023-patient was started on birth control-patient reports she is passing small clots 4. REGULARITY: How regular are your periods?     Normally regular-patient states she is being watched after a molar pregnancy 5. ABDOMEN PAIN: Do you have any pain? How bad is the pain?  (e.g.,  Scale 0-10; none, mild, moderate, or severe)     Mild pain 6. PREGNANCY: Is there any chance you are pregnant? When was your last menstrual period?     no 7. BREASTFEEDING: Are you breastfeeding?     no 8. HORMONE MEDICINES: Are you taking any hormone medicines, prescription or over-the-counter? (e.g., birth control pills, estrogen)     norethindrone -ethinyl estradiol  9. BLOOD THINNER MEDICINES: Do you take any blood thinners? (e.g., Coumadin / warfarin, Pradaxa / dabigatran, aspirin)     no 10. CAUSE: What do you think is causing the bleeding? (e.g., recent gyn surgery, recent gyn procedure; known bleeding disorder, cervical cancer, polycystic ovarian disease, fibroids)         Recently started on birth control 11. HEMODYNAMIC STATUS: Are you weak or feeling lightheaded? If Yes, ask: Can you stand and walk normally?        Yes-weakness, can stand and walk normally.  12. OTHER SYMPTOMS: What other symptoms are you having with the bleeding? (e.g., passed tissue, vaginal discharge, fever, menstrual-type cramps)       No  Education given to patient about the birth control she was placed on. Patient to call her OB to be evaluated in regard to her birth control. Discussed with patient getting an appointment tomorrow with PCP but patient would rather speak to OB first before making another appointment with PCP.  Protocols used: Vaginal Bleeding - Abnormal-A-AH

## 2023-09-27 NOTE — Telephone Encounter (Signed)
 Noted

## 2023-10-25 ENCOUNTER — Ambulatory Visit: Admitting: Gastroenterology

## 2023-10-25 NOTE — Progress Notes (Deleted)
 Anita Mcgee [id number] 2000/09/30   Chief Complaint:  Referring Provider: Jason Leita Repine,* Primary GI MD: Dr. Avram  HPI: Anita Mcgee is a 23 y.o. female with past medical history of molar pregnancy 2025, IBS, migraines, heavy menstrual bleeding, iron  deficiency who presents today for a complaint of abdominal pain.    Office visit 10/20/2019 with Dr. Avram for abdominal pain and diarrhea, which has been a longstanding problem.  Thought to be lactose intolerant, also has history of IBS.  History of vasovagal syncope.  Several ER visits for this and seen by neurology as well.  Possibly had some seizure type activity but does not carry diagnosis of seizures.  Intermittent GI symptoms.  Studies ordered at that time were celiac studies, CRP, CBC, fecal calprotectin, and she was prescribed hyoscyamine , with consideration for amitriptyline therapy if no further workup indicated.  Negative for celiac disease, normal CRP, normal CBC, does not look like she completed fecal calprotectin.  Was lost to follow-up after that.  Labs 09/19/2023: Normal CBC, CMP, TSH, B12.  Iron  57, percent saturation 14, ferritin 10.  hCG negative on 09/21/2023.  Discussed the use of AI scribe software for clinical note transcription with the patient, who gave verbal consent to proceed.  History of Present Illness       Previous GI Procedures/Imaging      Past Medical History:  Diagnosis Date   History of molar pregnancy 2025   Irritable bowel 04/20/2015   Migraine    Vasovagal syncope 10/20/2019    Past Surgical History:  Procedure Laterality Date   DILATION AND CURETTAGE OF UTERUS     per pt    Current Outpatient Medications  Medication Sig Dispense Refill   alclomethasone (ACLOVATE ) 0.05 % ointment Apply 1 Application topically 2 (two) times daily for up to 7-10. 15 g 0   cetirizine  (ZYRTEC  ALLERGY) 10 MG tablet Take 1 tablet (10 mg total) by mouth daily. 30 tablet 0    cyclobenzaprine  (FLEXERIL ) 10 MG tablet Insert 1 tablet (10 mg total) into vagina before intercourse, max 1 per day 30 tablet 1   desonide  (DESOWEN ) 0.05 % ointment Apply 1 Application topically 2 (two) times daily for up to 7-10 days. 15 g 0   Iron , Ferrous Sulfate , 325 (65 Fe) MG TABS Take 325 mg by mouth daily. 90 tablet 0   levothyroxine  (SYNTHROID ) 50 MCG tablet Take 1 tablet (50 mcg total) by mouth daily before breakfast. (Patient not taking: Reported on 09/19/2023) 90 tablet 0   norethindrone -ethinyl estradiol  (LOESTRIN 1/20, 21,) 1-20 MG-MCG tablet Take 1 tablet by mouth daily. 28 tablet 6   ondansetron  (ZOFRAN -ODT) 4 MG disintegrating tablet Take 1 tablet (4 mg total) by mouth every 8 (eight) hours as needed for nausea or vomiting. 10 tablet 0   sertraline  (ZOLOFT ) 50 MG tablet Take 1 tablet (50 mg total) by mouth daily. 90 tablet 3   tacrolimus  (PROTOPIC ) 0.1 % ointment Apply 1 Application topically 2 (two) times daily. REFRIGERATE if causes warming/stinging 60 g 2   valACYclovir  (VALTREX ) 1000 MG tablet Take 1 tablet (1,000 mg total) by mouth 3 (three) times daily. 30 tablet 0   valACYclovir  (VALTREX ) 500 MG tablet Take 1 tablet (500 mg total) by mouth daily. 90 tablet 3   No current facility-administered medications for this visit.    Allergies as of 10/25/2023 - Review Complete 09/19/2023  Allergen Reaction Noted   Ibuprofen Other (See Comments) 01/12/2015   Lactose intolerance (gi) Diarrhea and Nausea And Vomiting  10/03/2021    Family History  Problem Relation Age of Onset   Hypertension Father    Hypertension Maternal Grandmother    Diabetes Maternal Grandmother    Hypertension Paternal Grandfather    Heart disease Paternal Grandfather    Diabetes Paternal Grandfather    Stomach cancer Paternal Grandmother    Esophageal cancer Paternal Grandmother    Colon cancer Neg Hx    Pancreatic cancer Neg Hx    Liver disease Neg Hx     Social History   Tobacco Use   Smoking  status: Every Day    Types: E-cigarettes    Start date: 2020   Smokeless tobacco: Never  Vaping Use   Vaping status: Every Day   Start date: 01/09/2017   Substances: Nicotine  Substance Use Topics   Alcohol use: Yes    Comment: Socially   Drug use: No     Review of Systems:    Constitutional: No weight loss, fever, chills, weakness or fatigue Eyes: No change in vision Ears, Nose, Throat:  No change in hearing or congestion Skin: No rash or itching Cardiovascular: No chest pain, chest pressure or palpitations   Respiratory: No SOB or cough Gastrointestinal: See HPI and otherwise negative Genitourinary: No dysuria or change in urinary frequency Neurological: No headache, dizziness or syncope Musculoskeletal: No new muscle or joint pain Hematologic: No bleeding or bruising    Physical Exam:  Vital signs: There were no vitals taken for this visit.  Constitutional: NAD, Well developed, Well nourished, alert and cooperative Head:  Normocephalic and atraumatic.  Eyes: No scleral icterus. Conjunctiva pink. Mouth: No oral lesions. Respiratory: Respirations even and unlabored. Lungs clear to auscultation bilaterally.  No wheezes, crackles, or rhonchi.  Cardiovascular:  Regular rate and rhythm. No murmurs. No peripheral edema. Gastrointestinal:  Soft, nondistended, nontender. No rebound or guarding. Normal bowel sounds. No appreciable masses or hepatomegaly. Rectal:  Not performed.  Neurologic:  Alert and oriented x4;  grossly normal neurologically.  Skin:   Dry and intact without significant lesions or rashes. Psychiatric: Oriented to person, place and time. Demonstrates good judgement and reason without abnormal affect or behaviors.   RELEVANT LABS AND IMAGING: CBC    Component Value Date/Time   WBC 4.8 09/19/2023 0944   RBC 4.67 09/19/2023 0944   HGB 12.4 09/19/2023 0944   HCT 38.2 09/19/2023 0944   PLT 326.0 09/19/2023 0944   MCV 81.9 09/19/2023 0944   MCH 27.7  08/03/2020 0716   MCHC 32.5 09/19/2023 0944   RDW 14.5 09/19/2023 0944   LYMPHSABS 1.0 09/19/2023 0944   MONOABS 0.4 09/19/2023 0944   EOSABS 0.1 09/19/2023 0944   BASOSABS 0.1 09/19/2023 0944    CMP     Component Value Date/Time   NA 137 09/19/2023 0944   K 3.8 09/19/2023 0944   CL 103 09/19/2023 0944   CO2 26 09/19/2023 0944   GLUCOSE 84 09/19/2023 0944   BUN 7 09/19/2023 0944   CREATININE 0.83 09/19/2023 0944   CALCIUM 10.3 09/19/2023 0944   PROT 8.1 09/19/2023 0944   ALBUMIN 5.1 09/19/2023 0944   AST 16 09/19/2023 0944   ALT 12 09/19/2023 0944   ALKPHOS 44 09/19/2023 0944   BILITOT 0.7 09/19/2023 0944   GFRNONAA >60 08/03/2020 0716     Assessment/Plan:       Camie Furbish, PA-C  Gastroenterology 10/25/2023, 1:24 PM  Patient Care Team: Wheeler Harlene CROME, NP as PCP - General (Nurse Practitioner) Georjean Darice HERO, MD as  Consulting Physician (Neurology) Skeet Juliene SAUNDERS, DO as Consulting Physician (Neurology) [facility]

## 2023-11-01 NOTE — Progress Notes (Unsigned)
 Subjective:     Patient ID: Anita Mcgee, female    DOB: Sep 30, 2000, 23 y.o.   MRN: [id number]  No chief complaint on file.   HPI  Discussed the use of AI scribe software for clinical note transcription with the patient, who gave verbal consent to proceed.  Anxiety  Prior office visit was started on Zoloft , reports she is doing *** on medication  Anemia R/t heavy menstrual bleeding   Contraceptive management Prescribed Loestrin at prior office visit, reports she is doing well on medicine  Patient denies fever, chills, SOB, CP, palpitations, dyspnea, edema, HA, vision changes, N/V/D, abdominal pain, urinary symptoms, rash, weight changes, and recent illness or hospitalizations.   History of Present Illness              Health Maintenance Due  Topic Date Due   DTaP/Tdap/Td (6 - Tdap) 06/13/2011   HPV VACCINES (1 - 3-dose series) Never done   HIV Screening  Never done   Meningococcal B Vaccine (1 of 2 - Standard) Never done   Hepatitis C Screening  Never done   Pneumococcal Vaccine (1 of 2 - PCV) 06/13/2019   CHLAMYDIA SCREENING  06/30/2023    Past Medical History:  Diagnosis Date   History of molar pregnancy 2025   Irritable bowel 04/20/2015   Migraine    Vasovagal syncope 10/20/2019    Past Surgical History:  Procedure Laterality Date   DILATION AND CURETTAGE OF UTERUS     per pt    Family History  Problem Relation Age of Onset   Hypertension Father    Hypertension Maternal Grandmother    Diabetes Maternal Grandmother    Hypertension Paternal Grandfather    Heart disease Paternal Grandfather    Diabetes Paternal Grandfather    Stomach cancer Paternal Grandmother    Esophageal cancer Paternal Grandmother    Colon cancer Neg Hx    Pancreatic cancer Neg Hx    Liver disease Neg Hx     Social History   Socioeconomic History   Marital status: Single    Spouse name: Not on file   Number of children: 0   Years of education: 12   Highest  education level: Not on file  Occupational History   Occupation: Lobbyist: [employer]  Tobacco Use   Smoking status: Every Day    Types: E-cigarettes    Start date: 2020   Smokeless tobacco: Never  Vaping Use   Vaping status: Every Day   Start date: 01/09/2017   Substances: Nicotine  Substance and Sexual Activity   Alcohol use: Yes    Comment: Socially   Drug use: No   Sexual activity: Yes    Birth control/protection: None  Other Topics Concern   Not on file  Social History Narrative   Fun: Netflix, softball   Denies abuse and feels safe at home.    Left Handed   Lives in a one story home but stays with boyfriend who lives upstairs most of the time.   Drinks water and sweet tea.      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 [employer]   Never smoker no tobacco drugs or alcohol   Social Drivers of Corporate investment banker Strain: Not on file  Food Insecurity: No Food Insecurity (10/03/2021)   [facility]   Hunger Vital Sign    Within the past 12 months, you worried that your food would run out before you  got the money to buy more.: Never true    Within the past 12 months, the food you bought just didn't last and you didn't have money to get more.: Never true  Transportation Needs: Not on file  Physical Activity: Not on file  Stress: Not on file  Social Connections: Unknown (05/22/2021)   Received from [facility]   Social Network    Social Network: Not on file  Intimate Partner Violence: Unknown (04/13/2021)   Received from [facility]   HITS    Physically Hurt: Not on file    Insult or Talk Down To: Not on file    Threaten Physical Harm: Not on file    Scream or Curse: Not on file    Outpatient Medications Prior to Visit  Medication Sig Dispense Refill   alclomethasone (ACLOVATE ) 0.05 % ointment Apply 1 Application topically 2 (two) times daily for up to 7-10. 15 g 0   cetirizine  (ZYRTEC  ALLERGY) 10 MG tablet Take 1 tablet (10 mg total) by  mouth daily. 30 tablet 0   cyclobenzaprine  (FLEXERIL ) 10 MG tablet Insert 1 tablet (10 mg total) into vagina before intercourse, max 1 per day 30 tablet 1   desonide  (DESOWEN ) 0.05 % ointment Apply 1 Application topically 2 (two) times daily for up to 7-10 days. 15 g 0   Iron , Ferrous Sulfate , 325 (65 Fe) MG TABS Take 325 mg by mouth daily. 90 tablet 0   levothyroxine  (SYNTHROID ) 50 MCG tablet Take 1 tablet (50 mcg total) by mouth daily before breakfast. (Patient not taking: Reported on 09/19/2023) 90 tablet 0   norethindrone -ethinyl estradiol  (LOESTRIN 1/20, 21,) 1-20 MG-MCG tablet Take 1 tablet by mouth daily. 28 tablet 6   ondansetron  (ZOFRAN -ODT) 4 MG disintegrating tablet Take 1 tablet (4 mg total) by mouth every 8 (eight) hours as needed for nausea or vomiting. 10 tablet 0   sertraline  (ZOLOFT ) 50 MG tablet Take 1 tablet (50 mg total) by mouth daily. 90 tablet 3   tacrolimus  (PROTOPIC ) 0.1 % ointment Apply 1 Application topically 2 (two) times daily. REFRIGERATE if causes warming/stinging 60 g 2   valACYclovir  (VALTREX ) 1000 MG tablet Take 1 tablet (1,000 mg total) by mouth 3 (three) times daily. 30 tablet 0   valACYclovir  (VALTREX ) 500 MG tablet Take 1 tablet (500 mg total) by mouth daily. 90 tablet 3   No facility-administered medications prior to visit.    Allergies  Allergen Reactions   Ibuprofen Other (See Comments)    Makes headaches worse Migraine Headaches   Lactose Intolerance (Gi) Diarrhea and Nausea And Vomiting    ROS See HPI    Objective:    Physical Exam General: No acute distress. Awake and conversant.  Eyes: Normal conjunctiva, anicteric. Round symmetric pupils.  ENT: Hearing grossly intact. No nasal discharge.  Neck: Neck is supple. No masses or thyromegaly.  Respiratory: CTAB. Respirations are non-labored. No wheezing.  Skin: Warm. No rashes or ulcers.  Psych: Alert and oriented. Cooperative, Appropriate mood and affect, Normal judgment.  CV: RRR. No murmur.  No lower extremity edema.  MSK: Normal ambulation. No clubbing or cyanosis.  Neuro:  CN II-XII grossly normal.    There were no vitals taken for this visit. Wt Readings from Last 3 Encounters:  09/19/23 113 lb 12.8 oz (51.6 kg)  01/17/23 121 lb (54.9 kg)  02/07/22 119 lb 12.8 oz (54.3 kg)       Assessment & Plan:   Problem List Items Addressed This Visit  Abnormal TSH   Last TSH stable. Not taking levothyroxine  currently. Continue to monitor.      Anxiety - Primary   Contraception management   Other Visit Diagnoses       Low ferritin level           I am having [name] maintain her levothyroxine , alclomethasone, tacrolimus , desonide , valACYclovir , ondansetron , cyclobenzaprine , valACYclovir , cetirizine , sertraline , norethindrone -ethinyl estradiol , and Iron  (Ferrous Sulfate ).  No orders of the defined types were placed in this encounter.

## 2023-11-01 NOTE — Assessment & Plan Note (Addendum)
 Last TSH stable. Not taking levothyroxine  currently. Continue to monitor.

## 2023-11-02 ENCOUNTER — Ambulatory Visit (INDEPENDENT_AMBULATORY_CARE_PROVIDER_SITE_OTHER): Admitting: Student

## 2023-11-02 ENCOUNTER — Encounter: Payer: Self-pay | Admitting: Student

## 2023-11-02 DIAGNOSIS — Z308 Encounter for other contraceptive management: Secondary | ICD-10-CM

## 2023-11-02 DIAGNOSIS — Z91199 Patient's noncompliance with other medical treatment and regimen due to unspecified reason: Secondary | ICD-10-CM

## 2023-11-02 DIAGNOSIS — R7989 Other specified abnormal findings of blood chemistry: Secondary | ICD-10-CM

## 2023-11-02 DIAGNOSIS — R790 Abnormal level of blood mineral: Secondary | ICD-10-CM

## 2023-11-02 DIAGNOSIS — F419 Anxiety disorder, unspecified: Secondary | ICD-10-CM
